# Patient Record
Sex: Male | Born: 1965
Health system: Southern US, Community
[De-identification: ages and names within clinical notes are randomized; demographics above are authoritative.]

## PROBLEM LIST (undated history)

## (undated) DIAGNOSIS — E785 Hyperlipidemia, unspecified: Secondary | ICD-10-CM

## (undated) HISTORY — DX: Hyperlipidemia, unspecified: E78.5

## (undated) HISTORY — PX: WISDOM TOOTH EXTRACTION: SHX21

## (undated) HISTORY — PX: JOINT REPLACEMENT: SHX530

## (undated) HISTORY — PX: HERNIA REPAIR: SHX51

---

## 1998-05-10 ENCOUNTER — Ambulatory Visit (HOSPITAL_BASED_OUTPATIENT_CLINIC_OR_DEPARTMENT_OTHER): Admission: RE | Admit: 1998-05-10 | Discharge: 1998-05-10 | Payer: Self-pay | Admitting: General Surgery

## 1999-01-31 ENCOUNTER — Ambulatory Visit (HOSPITAL_BASED_OUTPATIENT_CLINIC_OR_DEPARTMENT_OTHER): Admission: RE | Admit: 1999-01-31 | Discharge: 1999-01-31 | Payer: Self-pay | Admitting: General Surgery

## 2009-12-30 ENCOUNTER — Emergency Department (HOSPITAL_COMMUNITY): Admission: EM | Admit: 2009-12-30 | Discharge: 2009-12-30 | Payer: Self-pay | Admitting: Emergency Medicine

## 2010-10-06 LAB — CBC
MCHC: 34.5 g/dL (ref 30.0–36.0)
MCV: 88.8 fL (ref 78.0–100.0)
Platelets: 198 10*3/uL (ref 150–400)
RBC: 6.19 MIL/uL — ABNORMAL HIGH (ref 4.22–5.81)
WBC: 5.1 10*3/uL (ref 4.0–10.5)

## 2010-10-06 LAB — BASIC METABOLIC PANEL: Glucose, Bld: 122 mg/dL — ABNORMAL HIGH (ref 70–99)

## 2010-10-06 LAB — DIFFERENTIAL
Basophils Relative: 1 % (ref 0–1)
Eosinophils Relative: 1 % (ref 0–5)
Lymphs Abs: 1.6 10*3/uL (ref 0.7–4.0)
Monocytes Absolute: 0.4 10*3/uL (ref 0.1–1.0)
Neutrophils Relative %: 60 % (ref 43–77)

## 2013-03-22 ENCOUNTER — Other Ambulatory Visit: Payer: Self-pay | Admitting: Physician Assistant

## 2018-02-18 ENCOUNTER — Ambulatory Visit (INDEPENDENT_AMBULATORY_CARE_PROVIDER_SITE_OTHER): Payer: 59 | Admitting: Family Medicine

## 2018-02-18 ENCOUNTER — Encounter: Payer: Self-pay | Admitting: Family Medicine

## 2018-02-18 VITALS — BP 130/90 | HR 76 | Temp 98.1°F | Resp 14 | Ht 72.0 in | Wt 227.0 lb

## 2018-02-18 DIAGNOSIS — Z125 Encounter for screening for malignant neoplasm of prostate: Secondary | ICD-10-CM | POA: Diagnosis not present

## 2018-02-18 DIAGNOSIS — Z0000 Encounter for general adult medical examination without abnormal findings: Secondary | ICD-10-CM | POA: Diagnosis not present

## 2018-02-18 DIAGNOSIS — L290 Pruritus ani: Secondary | ICD-10-CM

## 2018-02-18 DIAGNOSIS — M25562 Pain in left knee: Secondary | ICD-10-CM | POA: Diagnosis not present

## 2018-02-18 DIAGNOSIS — Z13220 Encounter for screening for lipoid disorders: Secondary | ICD-10-CM

## 2018-02-18 NOTE — Progress Notes (Signed)
Subjective:    Patient ID: Johnny Clark, male    DOB: 26-Apr-1966, 52 y.o.   MRN: [id number]  HPI Patient is a very healthy 52 year old Caucasian male here today to establish care.  He denies any significant medical problems.  He denies any significant past medical history or surgical history.  2 major concerns.  #1 he has acute on chronic left knee pain.  He states that a horse fell on his knee approximately 7 years ago and injured it.  Initially the knee was extremely swollen however after time the swelling went down and the pain went away.  He sought no further treatment.  I would last 7 years however he complains of pain primarily over the medial and lateral joint line.  At times the knee will lock and catch on him.  He also does not feel that he can fully flex the knee.  On range of motion, the patient has crepitus in the knee joint as well as a palpable plica on either side of the patella that is causing him pain.  He has a negative Apley grind.  There is no laxity to varus or valgus stress.  He has a negative anterior and posterior drawer sign.  #2 he reports perirectal itching.  He states that as long as he has not gone to the bathroom he feels fine.  However after he defecates, he feels like he is unable to get all the stool off of him no matter how many times he wipes.  Frequently he will wipe so much that it becomes tender and sore.  However he continues to experience itching even after.  Quite often after he has a bowel movement, he will take a shower and this usually relieves the itching.  It occurs day and night.  No particular food makes it worse.  On rectal exam, there are no external hemorrhoids.  There are no visible fissures.  There is no abnormality seen around the rectum such as rashes or lesions. No past medical history on file.  Denies any surgical history No current outpatient medications on file prior to visit.   No current facility-administered medications on file prior to  visit.    No Known Allergies Social History   Socioeconomic History  . Marital status: Single    Spouse name: Not on file  . Number of children: Not on file  . Years of education: Not on file  . Highest education level: Not on file  Occupational History  . Not on file  Social Needs  . Financial resource strain: Not on file  . Food insecurity:    Worry: Not on file    Inability: Not on file  . Transportation needs:    Medical: Not on file    Non-medical: Not on file  Tobacco Use  . Smoking status: Never Smoker  . Smokeless tobacco: Never Used  Substance and Sexual Activity  . Alcohol use: Yes  . Drug use: Never  . Sexual activity: Yes  Lifestyle  . Physical activity:    Days per week: Not on file    Minutes per session: Not on file  . Stress: Not on file  Relationships  . Social connections:    Talks on phone: Not on file    Gets together: Not on file    Attends religious service: Not on file    Active member of club or organization: Not on file    Attends meetings of clubs or organizations: Not on  file    Relationship status: Not on file  . Intimate partner violence:    Fear of current or ex partner: Not on file    Emotionally abused: Not on file    Physically abused: Not on file    Forced sexual activity: Not on file  Other Topics Concern  . Not on file  Social History Narrative  . Not on file   Family History  Problem Relation Age of Onset  . Hypertension Mother   . Hypertension Father       Review of Systems  All other systems reviewed and are negative.      Objective:   Physical Exam  Constitutional: He is oriented to person, place, and time. He appears well-developed and well-nourished. No distress.  HENT:  Head: Normocephalic and atraumatic.  Right Ear: External ear normal.  Left Ear: External ear normal.  Nose: Nose normal.  Mouth/Throat: Oropharynx is clear and moist. No oropharyngeal exudate.  Eyes: Pupils are equal, round, and  reactive to light. Conjunctivae and EOM are normal. Right eye exhibits no discharge. Left eye exhibits no discharge. No scleral icterus.  Neck: Normal range of motion. Neck supple. No JVD present. No tracheal deviation present. No thyromegaly present.  Cardiovascular: Normal rate, regular rhythm, normal heart sounds and intact distal pulses. Exam reveals no gallop and no friction rub.  No murmur heard. Pulmonary/Chest: Effort normal and breath sounds normal. No stridor. No respiratory distress. He has no wheezes. He has no rales. He exhibits no tenderness.  Abdominal: Soft. Bowel sounds are normal. He exhibits no distension and no mass. There is no tenderness. There is no rebound and no guarding. No hernia.  Genitourinary: Rectum normal.  Musculoskeletal: He exhibits no edema or deformity.       Left knee: He exhibits decreased range of motion. He exhibits no swelling, no effusion, no deformity, no LCL laxity and normal meniscus. Tenderness found. Medial joint line and lateral joint line tenderness noted.  Lymphadenopathy:    He has no cervical adenopathy.  Neurological: He is alert and oriented to person, place, and time. He displays normal reflexes. No cranial nerve deficit or sensory deficit. He exhibits normal muscle tone. Coordination normal.  Skin: Skin is warm. No rash noted. He is not diaphoretic. No erythema. No pallor.  Psychiatric: He has a normal mood and affect. His behavior is normal. Judgment and thought content normal.  Vitals reviewed.         Assessment & Plan:  Acute pain of left knee - Plan: DG Knee Complete 4 Views Left  General medical exam - Plan: CBC with Differential/Platelet, COMPLETE METABOLIC PANEL WITH GFR, Lipid panel, PSA  Rectal itching  Prostate cancer screening - Plan: PSA  Screening cholesterol level - Plan: CBC with Differential/Platelet, COMPLETE METABOLIC PANEL WITH GFR, Lipid panel  I believe his left knee pain is most likely due to  osteoarthritis likely exacerbated by the injury he suffered after having a horse fall in his leg.  However there is also a palpable plica that is tender to palpation particular on the lateral aspect of his left patella.  Await the results of the knee x-ray for osteoarthritis.  If the knee x-ray is relatively normal, I would recommend an MRI to determine if the patient has underlying cartilage defect that may be amenable to arthroscopic surgery.  Regarding his perirectal itchy, there is no visible abnormality on rectal exam.  I recommended that he had a fiber supplement to allow more complete  evacuation so that the patient feels "clean" after defecating without having to wipe numerous times and still feels like he has left his rectal area soiled.  I believe that the residual fecal material may be likely irritating the skin causing the itching.  Regarding his physical exam, I recommended that he monitor his blood pressure as is borderline today.  He can do this at home twice a day and note the values and allow me to review them with him in a couple weeks.  I also asked him to return fasting for a CBC, CMP, fasting lipid panel, and a PSA.  I recommended scheduling the patient for a colonoscopy but he declined at the present time.

## 2018-02-25 ENCOUNTER — Ambulatory Visit
Admission: RE | Admit: 2018-02-25 | Discharge: 2018-02-25 | Disposition: A | Discharge status: Home / Self Care | Payer: Self-pay | Source: Ambulatory Visit | Attending: Family Medicine | Admitting: Family Medicine

## 2018-02-25 ENCOUNTER — Other Ambulatory Visit: Payer: 59

## 2018-02-25 DIAGNOSIS — M25562 Pain in left knee: Secondary | ICD-10-CM

## 2018-02-26 LAB — CBC WITH DIFFERENTIAL/PLATELET
Basophils Absolute: 32 cells/uL (ref 0–200)
Basophils Relative: 0.5 %
EOS PCT: 2.7 %
Eosinophils Absolute: 173 cells/uL (ref 15–500)
HCT: 49.1 % (ref 38.5–50.0)
HEMOGLOBIN: 16.4 g/dL (ref 13.2–17.1)
Lymphs Abs: 1734 cells/uL (ref 850–3900)
MCH: 30.3 pg (ref 27.0–33.0)
MCHC: 33.4 g/dL (ref 32.0–36.0)
MCV: 90.6 fL (ref 80.0–100.0)
MONOS PCT: 9.7 %
MPV: 10.9 fL (ref 7.5–12.5)
NEUTROS ABS: 3840 {cells}/uL (ref 1500–7800)
NEUTROS PCT: 60 %
PLATELETS: 238 10*3/uL (ref 140–400)
RBC: 5.42 10*6/uL (ref 4.20–5.80)
RDW: 13.3 % (ref 11.0–15.0)
TOTAL LYMPHOCYTE: 27.1 %
WBC mixed population: 621 cells/uL (ref 200–950)
WBC: 6.4 10*3/uL (ref 3.8–10.8)

## 2018-02-26 LAB — COMPLETE METABOLIC PANEL WITH GFR
AG Ratio: 1.6 (calc) (ref 1.0–2.5)
ALKALINE PHOSPHATASE (APISO): 65 U/L (ref 40–115)
ALT: 49 U/L — ABNORMAL HIGH (ref 9–46)
AST: 37 U/L — AB (ref 10–35)
Albumin: 4.4 g/dL (ref 3.6–5.1)
BILIRUBIN TOTAL: 0.7 mg/dL (ref 0.2–1.2)
BUN: 15 mg/dL (ref 7–25)
CHLORIDE: 104 mmol/L (ref 98–110)
CO2: 27 mmol/L (ref 20–32)
Calcium: 9.6 mg/dL (ref 8.6–10.3)
Creat: 0.9 mg/dL (ref 0.70–1.33)
GFR, Est African American: 115 mL/min/{1.73_m2} (ref >=60)
GFR, Est Non African American: 99 mL/min/{1.73_m2} (ref >=60)
GLUCOSE: 95 mg/dL (ref 65–99)
Globulin: 2.8 g/dL (calc) (ref 1.9–3.7)
Potassium: 4.3 mmol/L (ref 3.5–5.3)
Sodium: 139 mmol/L (ref 135–146)
Total Protein: 7.2 g/dL (ref 6.1–8.1)

## 2018-02-26 LAB — LIPID PANEL
CHOL/HDL RATIO: 4.5 (calc) (ref <5.0)
CHOLESTEROL: 189 mg/dL (ref <200)
HDL: 42 mg/dL (ref >40)
LDL Cholesterol (Calc): 124 mg/dL (calc) — ABNORMAL HIGH
Non-HDL Cholesterol (Calc): 147 mg/dL (calc) — ABNORMAL HIGH (ref <130)
Triglycerides: 120 mg/dL (ref <150)

## 2018-02-26 LAB — PSA: PSA: 0.6 ng/mL (ref <=4.0)

## 2018-03-04 ENCOUNTER — Other Ambulatory Visit: Payer: Self-pay | Admitting: Family Medicine

## 2018-03-04 DIAGNOSIS — R7989 Other specified abnormal findings of blood chemistry: Secondary | ICD-10-CM

## 2018-03-04 DIAGNOSIS — R945 Abnormal results of liver function studies: Principal | ICD-10-CM

## 2018-03-18 ENCOUNTER — Other Ambulatory Visit: Payer: 59

## 2018-03-18 DIAGNOSIS — R7989 Other specified abnormal findings of blood chemistry: Secondary | ICD-10-CM

## 2018-03-18 DIAGNOSIS — M25562 Pain in left knee: Secondary | ICD-10-CM | POA: Insufficient documentation

## 2018-03-18 DIAGNOSIS — R945 Abnormal results of liver function studies: Principal | ICD-10-CM

## 2018-03-18 DIAGNOSIS — M1990 Unspecified osteoarthritis, unspecified site: Secondary | ICD-10-CM | POA: Insufficient documentation

## 2018-03-19 LAB — COMPREHENSIVE METABOLIC PANEL
AG Ratio: 1.5 (calc) (ref 1.0–2.5)
ALBUMIN MSPROF: 4.3 g/dL (ref 3.6–5.1)
ALT: 42 U/L (ref 9–46)
AST: 34 U/L (ref 10–35)
Alkaline phosphatase (APISO): 68 U/L (ref 40–115)
BUN: 16 mg/dL (ref 7–25)
CHLORIDE: 107 mmol/L (ref 98–110)
CO2: 25 mmol/L (ref 20–32)
Calcium: 9.7 mg/dL (ref 8.6–10.3)
Creat: 1.06 mg/dL (ref 0.70–1.33)
GLOBULIN: 2.9 g/dL (ref 1.9–3.7)
GLUCOSE: 87 mg/dL (ref 65–99)
Potassium: 4.2 mmol/L (ref 3.5–5.3)
Sodium: 140 mmol/L (ref 135–146)
Total Bilirubin: 0.6 mg/dL (ref 0.2–1.2)
Total Protein: 7.2 g/dL (ref 6.1–8.1)

## 2018-04-07 ENCOUNTER — Encounter (INDEPENDENT_AMBULATORY_CARE_PROVIDER_SITE_OTHER): Payer: Self-pay | Admitting: Family Medicine

## 2018-04-07 ENCOUNTER — Encounter (INDEPENDENT_AMBULATORY_CARE_PROVIDER_SITE_OTHER): Payer: Self-pay

## 2018-04-07 ENCOUNTER — Ambulatory Visit (INDEPENDENT_AMBULATORY_CARE_PROVIDER_SITE_OTHER): Payer: 59 | Admitting: Family Medicine

## 2018-04-07 DIAGNOSIS — M25562 Pain in left knee: Secondary | ICD-10-CM

## 2018-04-07 DIAGNOSIS — G8929 Other chronic pain: Secondary | ICD-10-CM

## 2018-04-07 NOTE — Progress Notes (Signed)
   Office Visit Note   Patient: Johnny Clark           Date of Birth: Nov 18, 1965           MRN: [id number] Visit Date: 04/07/2018 Requested by: Donita BrooksPickard, Warren T, MD [address] [place], [place] [id number] PCP: Donita BrooksPickard, Warren T, MD  Subjective: Chief Complaint  Patient presents with  . Left Knee - Pain    Intermittent pain and swelling x 2 years, after a horse fell on that knee.  Pain has been more constant over the past 6 months.    HPI: He is here for another opinion regarding chronic left knee pain.  About 6 or 7 years ago a horse fell on his knee.  He had quite a bit of pain for several months, but then it became manageable.  He did well for several years after that but in the past 2 or 3 years he has had progressively worsening pain, now all day long.  It is primarily on the medial aspect but occasionally anterior.  He gets swelling and stiffness, no locking or giving way.  He had x-rays obtained showing end-stage medial compartment and moderate patellofemoral DJD.  We looked at those x-rays today.  He went to another orthopedist who discussed injections and total knee replacement.  Patient was not ready for surgery and he stated that cortisone shot was very painful and did not give him much relief in the past, so he chose a medial compartment unloading brace instead.  The brace made his pain worse.  He has since read about unicompartmental replacements and wonders whether he might be a candidate.  He continues to ride horses.  He is otherwise in very good health.              ROS: All other systems were negative  Objective: Vital Signs: There were no vitals taken for this visit.  Physical Exam:  Left knee: 1+ effusion, no warmth or erythema.  2+ patellofemoral crepitus but negative patella compression test.  Lachman's is solid, slight laxity with valgus stress but a solid endpoint.  Tender on the posterior medial joint line, moderate tenderness on the lateral joint line, no  pain or click with McMurray's test.  Imaging: No new x-rays today, we reviewed his previous x-rays from August.  Dr. Roda ShuttersXu looked at the x-rays as well but did not see the patient today.  Assessment & Plan: 1.  Chronic left knee pain mainly due to end-stage medial compartment DJD - Dr. Roda ShuttersXu felt that he might be a candidate for unicompartmental replacement but would like to see him first and discuss options in detail.  Patient will make an appointment at his earliest convenience.   Follow-Up Instructions: Return for Consult with Xu for unicompartmental replacement.       Procedures: None today.   PMFS History: There are no active problems to display for this patient.  History reviewed. No pertinent past medical history.  Family History  Problem Relation Age of Onset  . Hypertension Mother   . Hypertension Father     History reviewed. No pertinent surgical history. Social History   Occupational History  . Not on file  Tobacco Use  . Smoking status: Never Smoker  . Smokeless tobacco: Never Used  Substance and Sexual Activity  . Alcohol use: Yes  . Drug use: Never  . Sexual activity: Yes

## 2018-04-14 ENCOUNTER — Ambulatory Visit (INDEPENDENT_AMBULATORY_CARE_PROVIDER_SITE_OTHER): Payer: 59 | Admitting: Orthopaedic Surgery

## 2018-04-14 DIAGNOSIS — M1712 Unilateral primary osteoarthritis, left knee: Secondary | ICD-10-CM

## 2018-04-14 NOTE — Progress Notes (Signed)
   Office Visit Note   Patient: Johnny Clark           Date of Birth: 1966/02/05           MRN: [id number] Visit Date: 04/14/2018              Requested by: Donita Brooks, MD [address] [place], [place] [address] PCP: Donita Brooks, MD   Assessment & Plan: Visit Diagnoses:  1. Unilateral primary osteoarthritis, left knee     Plan: Impression is end-stage left knee tricompartment degenerative joint disease.  At this point patient has failed conservative treatment.  He seems to be struggling a lot in terms of quality life and activity.  Proceed with a total knee replacement after discussion of risks and benefits and rehab and recovery.  He denies history of DVT, nickel allergy, aspirin allergy. Total face to face encounter time was greater than 25 minutes and over half of this time was spent in counseling and/or coordination of care.  Follow-Up Instructions: Return for 2 week postop visit.   Orders:  No orders of the defined types were placed in this encounter.  No orders of the defined types were placed in this encounter.     Procedures: No procedures performed   Clinical Data: No additional findings.   Subjective: Chief Complaint  Patient presents with  . Left Knee - Pain    Johnny Clark is a 52 year old gentleman who comes in with chronic left knee pain.  He has had severe limitation of ADLs and quality of life.  He has had previous cortisone injections with minimal relief.  He has significant popping and catching that is worse with walking.  His knee wants to give out at random times.  He denies a history of surgery.  Denies any numbness and tingling or radiation of pain.   Review of Systems  Constitutional: Negative.   All other systems reviewed and are negative.    Objective: Vital Signs: There were no vitals taken for this visit.  Physical Exam  Constitutional: He is oriented to person, place, and time. He appears well-developed and  well-nourished.  HENT:  Head: Normocephalic and atraumatic.  Eyes: Pupils are equal, round, and reactive to light.  Neck: Neck supple.  Pulmonary/Chest: Effort normal.  Abdominal: Soft.  Musculoskeletal: Normal range of motion.  Neurological: He is alert and oriented to person, place, and time.  Skin: Skin is warm.  Psychiatric: He has a normal mood and affect. His behavior is normal. Judgment and thought content normal.  Nursing note and vitals reviewed.   Ortho Exam Left knee exam shows a small joint effusion.  He has a mild varus alignment.  Collaterals and cruciates are stable.  Medial joint line tenderness. Specialty Comments:  No specialty comments available.  Imaging: No results found.   PMFS History: Patient Active Problem List   Diagnosis Date Noted  . Pain in left knee 03/18/2018  . Osteoarthritis 03/18/2018   No past medical history on file.  Family History  Problem Relation Age of Onset  . Hypertension Mother   . Hypertension Father     No past surgical history on file. Social History   Occupational History  . Not on file  Tobacco Use  . Smoking status: Never Smoker  . Smokeless tobacco: Never Used  Substance and Sexual Activity  . Alcohol use: Yes  . Drug use: Never  . Sexual activity: Yes

## 2018-04-19 NOTE — Pre-Procedure Instructions (Signed)
Johnny Clark  04/19/2018      [id number] [place], [place] - [address] [place] [address] Phone: [phone] Fax: [phone]    Your procedure is scheduled on Monday October 14th.  Report to [facility] Admitting at 1300 A.M.  Call this number if you have problems the morning of surgery:  [phone]   Remember:  Do not eat or drink after midnight.      Take these medicines the morning of surgery with A SIP OF WATER   None  7 days prior to surgery STOP taking any Aspirin(unless otherwise instructed by your surgeon), Aleve, Naproxen, Ibuprofen, Motrin, Advil, Goody's, BC's, all herbal medications, fish oil, and all vitamins     Do not wear jewelry  Do not wear lotions, powders, or colognes, or deodorant.  Do not shave 48 hours prior to surgery.  Men may shave face and neck.  Do not bring valuables to the hospital.  [facility] is not responsible for any belongings or valuables.  Contacts, dentures or bridgework may not be worn into surgery.  Leave your suitcase in the car.  After surgery it may be brought to your room.  For patients admitted to the hospital, discharge time will be determined by your treatment team.  Patients discharged the day of surgery will not be allowed to drive home.    [facility]- Preparing For Surgery  Before surgery, you can play an important role. Because skin is not sterile, your skin needs to be as free of germs as possible. You can reduce the number of germs on your skin by washing with CHG (chlorahexidine gluconate) Soap before surgery.  CHG is an antiseptic cleaner which kills germs and bonds with the skin to continue killing germs even after washing.    Oral Hygiene is also important to reduce your risk of infection.  Remember - BRUSH YOUR TEETH THE MORNING OF SURGERY WITH YOUR REGULAR TOOTHPASTE  Please do not use if you have an allergy to CHG or  antibacterial soaps. If your skin becomes reddened/irritated stop using the CHG.  Do not shave (including legs and underarms) for at least 48 hours prior to first CHG shower. It is OK to shave your face.  Please follow these instructions carefully.   1. Shower the NIGHT BEFORE SURGERY and the MORNING OF SURGERY with CHG.   2. If you chose to wash your hair, wash your hair first as usual with your normal shampoo.  3. After you shampoo, rinse your hair and body thoroughly to remove the shampoo.  4. Use CHG as you would any other liquid soap. You can apply CHG directly to the skin and wash gently with a scrungie or a clean washcloth.   5. Apply the CHG Soap to your body ONLY FROM THE NECK DOWN.  Do not use on open wounds or open sores. Avoid contact with your eyes, ears, mouth and genitals (private parts). Wash Face and genitals (private parts)  with your normal soap.  6. Wash thoroughly, paying special attention to the area where your surgery will be performed.  7. Thoroughly rinse your body with warm water from the neck down.  8. DO NOT shower/wash with your normal soap after using and rinsing off the CHG Soap.  9. Pat yourself dry with a CLEAN TOWEL.  10. Wear CLEAN PAJAMAS to bed the night before surgery, wear comfortable clothes the morning  of surgery  11. Place CLEAN SHEETS on your bed the night of your first shower and DO NOT SLEEP WITH PETS.    Day of Surgery:  Do not apply any deodorants/lotions.  Please wear clean clothes to the hospital/surgery center.   Remember to brush your teeth WITH YOUR REGULAR TOOTHPASTE.    Please read over the following fact sheets that you were given.

## 2018-04-20 ENCOUNTER — Encounter (HOSPITAL_COMMUNITY)
Admission: RE | Admit: 2018-04-20 | Discharge: 2018-04-20 | Disposition: A | Discharge status: Home / Self Care | Payer: 59 | Source: Ambulatory Visit | Attending: Orthopaedic Surgery | Admitting: Orthopaedic Surgery

## 2018-04-20 ENCOUNTER — Other Ambulatory Visit: Payer: Self-pay

## 2018-04-20 ENCOUNTER — Encounter (HOSPITAL_COMMUNITY): Payer: Self-pay

## 2018-04-20 DIAGNOSIS — Z01818 Encounter for other preprocedural examination: Secondary | ICD-10-CM | POA: Diagnosis present

## 2018-04-20 DIAGNOSIS — R001 Bradycardia, unspecified: Secondary | ICD-10-CM | POA: Insufficient documentation

## 2018-04-20 DIAGNOSIS — M1712 Unilateral primary osteoarthritis, left knee: Secondary | ICD-10-CM | POA: Insufficient documentation

## 2018-04-20 LAB — CBC WITH DIFFERENTIAL/PLATELET
Abs Immature Granulocytes: 0 10*3/uL (ref 0.0–0.1)
BASOS ABS: 0 10*3/uL (ref 0.0–0.1)
Basophils Relative: 1 %
Eosinophils Absolute: 0.2 10*3/uL (ref 0.0–0.7)
Eosinophils Relative: 3 %
HCT: 46.2 % (ref 39.0–52.0)
HEMOGLOBIN: 15.6 g/dL (ref 13.0–17.0)
IMMATURE GRANULOCYTES: 0 %
LYMPHS PCT: 25 %
Lymphs Abs: 1.6 10*3/uL (ref 0.7–4.0)
MCH: 31.3 pg (ref 26.0–34.0)
MCHC: 33.8 g/dL (ref 30.0–36.0)
MCV: 92.6 fL (ref 78.0–100.0)
MONO ABS: 0.5 10*3/uL (ref 0.1–1.0)
MONOS PCT: 8 %
NEUTROS ABS: 4.2 10*3/uL (ref 1.7–7.7)
NEUTROS PCT: 63 %
Platelets: 217 10*3/uL (ref 150–400)
RBC: 4.99 MIL/uL (ref 4.22–5.81)
RDW: 13.1 % (ref 11.5–15.5)
WBC: 6.5 10*3/uL (ref 4.0–10.5)

## 2018-04-20 LAB — SURGICAL PCR SCREEN
MRSA, PCR: NEGATIVE
STAPHYLOCOCCUS AUREUS: NEGATIVE

## 2018-04-20 LAB — COMPREHENSIVE METABOLIC PANEL
ALK PHOS: 58 U/L (ref 38–126)
ALT: 45 U/L — AB (ref 0–44)
AST: 40 U/L (ref 15–41)
Albumin: 4.1 g/dL (ref 3.5–5.0)
Anion gap: 9 (ref 5–15)
BUN: 12 mg/dL (ref 6–20)
CALCIUM: 9.3 mg/dL (ref 8.9–10.3)
CHLORIDE: 106 mmol/L (ref 98–111)
CO2: 25 mmol/L (ref 22–32)
CREATININE: 0.94 mg/dL (ref 0.61–1.24)
GFR calc non Af Amer: >60 mL/min (ref >60)
GLUCOSE: 84 mg/dL (ref 70–99)
Potassium: 3.8 mmol/L (ref 3.5–5.1)
SODIUM: 140 mmol/L (ref 135–145)
Total Bilirubin: 0.8 mg/dL (ref 0.3–1.2)
Total Protein: 7 g/dL (ref 6.5–8.1)

## 2018-04-20 LAB — TYPE AND SCREEN
ABO/RH(D): A POS
ANTIBODY SCREEN: NEGATIVE

## 2018-04-20 LAB — ABO/RH: ABO/RH(D): A POS

## 2018-04-20 NOTE — Progress Notes (Signed)
PCP - Lynnea Ferrier MD  EKG - 04/20/18  Blood Thinner Instructions: N/A Aspirin Instructions: N/A  Anesthesia review:  none  Patient denies shortness of breath, fever, cough and chest pain at PAT appointment   Patient verbalized understanding of instructions that were given to them at the PAT appointment. Patient was also instructed that they will need to review over the PAT instructions again at home before surgery.

## 2018-04-29 MED ORDER — TRANEXAMIC ACID 1000 MG/10ML IV SOLN
2000.0000 mg | INTRAVENOUS | Status: AC
  Administered 2018-05-02: 2000 mg via TOPICAL
  Filled 2018-04-29: qty 20

## 2018-04-29 MED ORDER — TRANEXAMIC ACID-NACL 1000-0.7 MG/100ML-% IV SOLN
1000.0000 mg | INTRAVENOUS | Status: AC
  Administered 2018-05-02: 1000 mg via INTRAVENOUS
  Filled 2018-04-29: qty 100

## 2018-05-02 ENCOUNTER — Encounter (HOSPITAL_COMMUNITY): Payer: Self-pay | Admitting: Urology

## 2018-05-02 ENCOUNTER — Inpatient Hospital Stay (HOSPITAL_COMMUNITY): Payer: 59 | Admitting: Anesthesiology

## 2018-05-02 ENCOUNTER — Encounter (HOSPITAL_COMMUNITY)
Admission: RE | Disposition: A | Discharge status: Home / Self Care | Payer: Self-pay | Source: Home / Self Care | Attending: Orthopaedic Surgery

## 2018-05-02 ENCOUNTER — Inpatient Hospital Stay (HOSPITAL_COMMUNITY)
Admission: RE | Admit: 2018-05-02 | Discharge: 2018-05-03 | DRG: 470 | Disposition: A | Discharge status: Home / Self Care | Payer: 59 | Attending: Orthopaedic Surgery | Admitting: Orthopaedic Surgery

## 2018-05-02 ENCOUNTER — Inpatient Hospital Stay (HOSPITAL_COMMUNITY): Payer: 59

## 2018-05-02 DIAGNOSIS — Z96659 Presence of unspecified artificial knee joint: Secondary | ICD-10-CM

## 2018-05-02 DIAGNOSIS — Z8249 Family history of ischemic heart disease and other diseases of the circulatory system: Secondary | ICD-10-CM | POA: Diagnosis not present

## 2018-05-02 DIAGNOSIS — M1712 Unilateral primary osteoarthritis, left knee: Principal | ICD-10-CM

## 2018-05-02 DIAGNOSIS — Z96652 Presence of left artificial knee joint: Secondary | ICD-10-CM

## 2018-05-02 HISTORY — PX: TOTAL KNEE ARTHROPLASTY: SHX125

## 2018-05-02 SURGERY — ARTHROPLASTY, KNEE, TOTAL
Anesthesia: Spinal | Site: Knee | Laterality: Left

## 2018-05-02 MED ORDER — OXYCODONE HCL 5 MG PO TABS
5.0000 mg | ORAL_TABLET | ORAL | Status: DC | PRN
  Administered 2018-05-02 – 2018-05-03 (×3): 10 mg via ORAL
  Filled 2018-05-02 (×2): qty 2

## 2018-05-02 MED ORDER — GLYCOPYRROLATE 0.2 MG/ML IJ SOLN: INTRAMUSCULAR | Status: DC | PRN

## 2018-05-02 MED ORDER — MIDAZOLAM HCL 2 MG/2ML IJ SOLN
INTRAMUSCULAR | Status: AC
  Filled 2018-05-02: qty 2

## 2018-05-02 MED ORDER — GABAPENTIN 300 MG PO CAPS
ORAL_CAPSULE | ORAL | Status: AC
  Administered 2018-05-02: 300 mg via ORAL
  Filled 2018-05-02: qty 1

## 2018-05-02 MED ORDER — GABAPENTIN 300 MG PO CAPS
300.0000 mg | ORAL_CAPSULE | Freq: Once | ORAL | Status: AC
  Administered 2018-05-02: 300 mg via ORAL

## 2018-05-02 MED ORDER — BUPIVACAINE IN DEXTROSE 0.75-8.25 % IT SOLN
INTRATHECAL | Status: DC | PRN
  Administered 2018-05-02: 13.5 mg via INTRATHECAL

## 2018-05-02 MED ORDER — SODIUM CHLORIDE 0.9 % IR SOLN
Status: DC | PRN
  Administered 2018-05-02: 3000 mL

## 2018-05-02 MED ORDER — VANCOMYCIN HCL 1000 MG IV SOLR
INTRAVENOUS | Status: AC
  Filled 2018-05-02: qty 1000

## 2018-05-02 MED ORDER — KETOROLAC TROMETHAMINE 15 MG/ML IJ SOLN
30.0000 mg | Freq: Four times a day (QID) | INTRAMUSCULAR | Status: AC
  Administered 2018-05-02 – 2018-05-03 (×4): 30 mg via INTRAVENOUS
  Filled 2018-05-02 (×4): qty 2

## 2018-05-02 MED ORDER — TRANEXAMIC ACID-NACL 1000-0.7 MG/100ML-% IV SOLN
1000.0000 mg | Freq: Once | INTRAVENOUS | Status: AC
  Administered 2018-05-02: 1000 mg via INTRAVENOUS
  Filled 2018-05-02: qty 100

## 2018-05-02 MED ORDER — CHLORHEXIDINE GLUCONATE 4 % EX LIQD: 60.0000 mL | Freq: Once | CUTANEOUS | Status: DC

## 2018-05-02 MED ORDER — ACETAMINOPHEN 325 MG PO TABS: 325.0000 mg | ORAL_TABLET | Freq: Four times a day (QID) | ORAL | Status: DC | PRN

## 2018-05-02 MED ORDER — ASPIRIN 81 MG PO CHEW
81.0000 mg | CHEWABLE_TABLET | Freq: Two times a day (BID) | ORAL | Status: DC
  Administered 2018-05-02 – 2018-05-03 (×2): 81 mg via ORAL
  Filled 2018-05-02 (×2): qty 1

## 2018-05-02 MED ORDER — ACETAMINOPHEN 500 MG PO TABS
1000.0000 mg | ORAL_TABLET | Freq: Once | ORAL | Status: AC
  Administered 2018-05-02: 1000 mg via ORAL

## 2018-05-02 MED ORDER — PROPOFOL 500 MG/50ML IV EMUL
INTRAVENOUS | Status: DC | PRN
  Administered 2018-05-02: 75 ug/kg/min via INTRAVENOUS

## 2018-05-02 MED ORDER — PHENOL 1.4 % MT LIQD: 1.0000 | OROMUCOSAL | Status: DC | PRN

## 2018-05-02 MED ORDER — HYDROMORPHONE HCL 1 MG/ML IJ SOLN: 0.2500 mg | INTRAMUSCULAR | Status: DC | PRN

## 2018-05-02 MED ORDER — OXYCODONE HCL 5 MG PO TABS: 5.0000 mg | ORAL_TABLET | ORAL | 0 refills | Status: DC | PRN

## 2018-05-02 MED ORDER — DOCUSATE SODIUM 100 MG PO CAPS
100.0000 mg | ORAL_CAPSULE | Freq: Two times a day (BID) | ORAL | Status: DC
  Administered 2018-05-02 – 2018-05-03 (×2): 100 mg via ORAL
  Filled 2018-05-02 (×3): qty 1

## 2018-05-02 MED ORDER — SENNOSIDES-DOCUSATE SODIUM 8.6-50 MG PO TABS: 1.0000 | ORAL_TABLET | Freq: Every evening | ORAL | 1 refills | Status: DC | PRN

## 2018-05-02 MED ORDER — ACETAMINOPHEN 500 MG PO TABS
1000.0000 mg | ORAL_TABLET | Freq: Four times a day (QID) | ORAL | Status: AC
  Administered 2018-05-02 – 2018-05-03 (×4): 1000 mg via ORAL
  Filled 2018-05-02 (×4): qty 2

## 2018-05-02 MED ORDER — MIDAZOLAM HCL 2 MG/2ML IJ SOLN
INTRAMUSCULAR | Status: AC
  Administered 2018-05-02: 2 mg via INTRAVENOUS
  Filled 2018-05-02: qty 2

## 2018-05-02 MED ORDER — METHOCARBAMOL 500 MG PO TABS
ORAL_TABLET | ORAL | Status: AC
  Filled 2018-05-02: qty 1

## 2018-05-02 MED ORDER — ALUM & MAG HYDROXIDE-SIMETH 200-200-20 MG/5ML PO SUSP: 30.0000 mL | ORAL | Status: DC | PRN

## 2018-05-02 MED ORDER — ONDANSETRON HCL 4 MG/2ML IJ SOLN: 4.0000 mg | Freq: Four times a day (QID) | INTRAMUSCULAR | Status: DC | PRN

## 2018-05-02 MED ORDER — OXYCODONE HCL 5 MG PO TABS
10.0000 mg | ORAL_TABLET | ORAL | Status: DC | PRN
  Administered 2018-05-03: 15 mg via ORAL
  Filled 2018-05-02: qty 3

## 2018-05-02 MED ORDER — OXYCODONE HCL ER 10 MG PO T12A: 10.0000 mg | EXTENDED_RELEASE_TABLET | Freq: Two times a day (BID) | ORAL | 0 refills | Status: DC

## 2018-05-02 MED ORDER — ROPIVACAINE HCL 5 MG/ML IJ SOLN
INTRAMUSCULAR | Status: DC | PRN
  Administered 2018-05-02: 20 mL via PERINEURAL

## 2018-05-02 MED ORDER — FENTANYL CITRATE (PF) 100 MCG/2ML IJ SOLN
INTRAMUSCULAR | Status: AC
  Administered 2018-05-02: 100 ug via INTRAVENOUS
  Filled 2018-05-02: qty 2

## 2018-05-02 MED ORDER — ATROPINE SULFATE 0.4 MG/ML IJ SOLN: INTRAMUSCULAR | Status: DC | PRN

## 2018-05-02 MED ORDER — FENTANYL CITRATE (PF) 100 MCG/2ML IJ SOLN
INTRAMUSCULAR | Status: DC | PRN
  Administered 2018-05-02: 50 ug via INTRAVENOUS

## 2018-05-02 MED ORDER — HYDROMORPHONE HCL 1 MG/ML IJ SOLN: 0.5000 mg | INTRAMUSCULAR | Status: DC | PRN

## 2018-05-02 MED ORDER — DEXAMETHASONE SODIUM PHOSPHATE 10 MG/ML IJ SOLN
10.0000 mg | Freq: Once | INTRAMUSCULAR | Status: AC
  Administered 2018-05-03: 10 mg via INTRAVENOUS
  Filled 2018-05-02: qty 1

## 2018-05-02 MED ORDER — ASPIRIN EC 81 MG PO TBEC: 81.0000 mg | DELAYED_RELEASE_TABLET | Freq: Two times a day (BID) | ORAL | 0 refills | Status: DC

## 2018-05-02 MED ORDER — CEFAZOLIN SODIUM-DEXTROSE 2-4 GM/100ML-% IV SOLN
2.0000 g | Freq: Four times a day (QID) | INTRAVENOUS | Status: AC
  Administered 2018-05-02 – 2018-05-03 (×3): 2 g via INTRAVENOUS
  Filled 2018-05-02 (×3): qty 100

## 2018-05-02 MED ORDER — FENTANYL CITRATE (PF) 100 MCG/2ML IJ SOLN
100.0000 ug | Freq: Once | INTRAMUSCULAR | Status: AC
  Administered 2018-05-02: 100 ug via INTRAVENOUS

## 2018-05-02 MED ORDER — ONDANSETRON HCL 4 MG/2ML IJ SOLN
INTRAMUSCULAR | Status: DC | PRN
  Administered 2018-05-02: 4 mg via INTRAVENOUS

## 2018-05-02 MED ORDER — VANCOMYCIN HCL 1000 MG IV SOLR
INTRAVENOUS | Status: DC | PRN
  Administered 2018-05-02: 1000 mg via TOPICAL

## 2018-05-02 MED ORDER — SORBITOL 70 % SOLN: 30.0000 mL | Freq: Every day | Status: DC | PRN

## 2018-05-02 MED ORDER — BUPIVACAINE LIPOSOME 1.3 % IJ SUSP
20.0000 mL | INTRAMUSCULAR | Status: AC
  Administered 2018-05-02: 20 mL
  Filled 2018-05-02: qty 20

## 2018-05-02 MED ORDER — METHOCARBAMOL 750 MG PO TABS: 750.0000 mg | ORAL_TABLET | Freq: Two times a day (BID) | ORAL | 0 refills | Status: DC | PRN

## 2018-05-02 MED ORDER — OXYCODONE HCL 5 MG PO TABS
ORAL_TABLET | ORAL | Status: AC
  Filled 2018-05-02: qty 2

## 2018-05-02 MED ORDER — ONDANSETRON HCL 4 MG PO TABS: 4.0000 mg | ORAL_TABLET | Freq: Three times a day (TID) | ORAL | 0 refills | Status: DC | PRN

## 2018-05-02 MED ORDER — MIDAZOLAM HCL 2 MG/2ML IJ SOLN
2.0000 mg | Freq: Once | INTRAMUSCULAR | Status: AC
  Administered 2018-05-02: 2 mg via INTRAVENOUS

## 2018-05-02 MED ORDER — METHOCARBAMOL 1000 MG/10ML IJ SOLN
500.0000 mg | Freq: Four times a day (QID) | INTRAVENOUS | Status: DC | PRN
  Filled 2018-05-02: qty 5

## 2018-05-02 MED ORDER — ONDANSETRON HCL 4 MG PO TABS: 4.0000 mg | ORAL_TABLET | Freq: Four times a day (QID) | ORAL | Status: DC | PRN

## 2018-05-02 MED ORDER — DIPHENHYDRAMINE HCL 12.5 MG/5ML PO ELIX: 25.0000 mg | ORAL_SOLUTION | ORAL | Status: DC | PRN

## 2018-05-02 MED ORDER — ONDANSETRON HCL 4 MG/2ML IJ SOLN
INTRAMUSCULAR | Status: AC
  Filled 2018-05-02: qty 2

## 2018-05-02 MED ORDER — MIDAZOLAM HCL 5 MG/5ML IJ SOLN
INTRAMUSCULAR | Status: DC | PRN
  Administered 2018-05-02: 0.5 mg via INTRAVENOUS
  Administered 2018-05-02: 2 mg via INTRAVENOUS
  Administered 2018-05-02: 1 mg via INTRAVENOUS
  Administered 2018-05-02: 0.5 mg via INTRAVENOUS

## 2018-05-02 MED ORDER — SODIUM CHLORIDE 0.9% FLUSH
INTRAVENOUS | Status: DC | PRN
  Administered 2018-05-02 (×4): 10 mL

## 2018-05-02 MED ORDER — METOCLOPRAMIDE HCL 5 MG PO TABS: 5.0000 mg | ORAL_TABLET | Freq: Three times a day (TID) | ORAL | Status: DC | PRN

## 2018-05-02 MED ORDER — MENTHOL 3 MG MT LOZG: 1.0000 | LOZENGE | OROMUCOSAL | Status: DC | PRN

## 2018-05-02 MED ORDER — SODIUM CHLORIDE 0.9 % IV SOLN
INTRAVENOUS | Status: DC
  Administered 2018-05-02 – 2018-05-03 (×2): via INTRAVENOUS

## 2018-05-02 MED ORDER — MAGNESIUM CITRATE PO SOLN: 1.0000 | Freq: Once | ORAL | Status: DC | PRN

## 2018-05-02 MED ORDER — ACETAMINOPHEN 500 MG PO TABS
ORAL_TABLET | ORAL | Status: AC
  Administered 2018-05-02: 1000 mg via ORAL
  Filled 2018-05-02: qty 2

## 2018-05-02 MED ORDER — PROMETHAZINE HCL 25 MG PO TABS: 25.0000 mg | ORAL_TABLET | Freq: Four times a day (QID) | ORAL | 1 refills | Status: DC | PRN

## 2018-05-02 MED ORDER — 0.9 % SODIUM CHLORIDE (POUR BTL) OPTIME
TOPICAL | Status: DC | PRN
  Administered 2018-05-02: 1000 mL

## 2018-05-02 MED ORDER — FENTANYL CITRATE (PF) 250 MCG/5ML IJ SOLN
INTRAMUSCULAR | Status: AC
  Filled 2018-05-02: qty 5

## 2018-05-02 MED ORDER — LIDOCAINE HCL (CARDIAC) PF 100 MG/5ML IV SOSY
PREFILLED_SYRINGE | INTRAVENOUS | Status: DC | PRN
  Administered 2018-05-02: 30 mg via INTRAVENOUS

## 2018-05-02 MED ORDER — LACTATED RINGERS IV SOLN
INTRAVENOUS | Status: DC
  Administered 2018-05-02 (×2): via INTRAVENOUS

## 2018-05-02 MED ORDER — CELECOXIB 200 MG PO CAPS
200.0000 mg | ORAL_CAPSULE | Freq: Two times a day (BID) | ORAL | Status: DC
  Administered 2018-05-03: 200 mg via ORAL
  Filled 2018-05-02 (×2): qty 1

## 2018-05-02 MED ORDER — OXYCODONE HCL ER 10 MG PO T12A
10.0000 mg | EXTENDED_RELEASE_TABLET | Freq: Two times a day (BID) | ORAL | Status: DC
  Administered 2018-05-02 – 2018-05-03 (×2): 10 mg via ORAL
  Filled 2018-05-02 (×2): qty 1

## 2018-05-02 MED ORDER — CEFAZOLIN SODIUM-DEXTROSE 2-4 GM/100ML-% IV SOLN
2.0000 g | INTRAVENOUS | Status: AC
  Administered 2018-05-02: 2 g via INTRAVENOUS

## 2018-05-02 MED ORDER — DEXMEDETOMIDINE HCL IN NACL 200 MCG/50ML IV SOLN
INTRAVENOUS | Status: DC | PRN
  Administered 2018-05-02 (×3): 8 ug via INTRAVENOUS

## 2018-05-02 MED ORDER — METOCLOPRAMIDE HCL 5 MG/ML IJ SOLN: 5.0000 mg | Freq: Three times a day (TID) | INTRAMUSCULAR | Status: DC | PRN

## 2018-05-02 MED ORDER — CEFAZOLIN SODIUM-DEXTROSE 2-4 GM/100ML-% IV SOLN
INTRAVENOUS | Status: AC
  Filled 2018-05-02: qty 100

## 2018-05-02 MED ORDER — GABAPENTIN 300 MG PO CAPS
300.0000 mg | ORAL_CAPSULE | Freq: Three times a day (TID) | ORAL | Status: DC
  Administered 2018-05-02 – 2018-05-03 (×3): 300 mg via ORAL
  Filled 2018-05-02 (×3): qty 1

## 2018-05-02 MED ORDER — METHOCARBAMOL 500 MG PO TABS
500.0000 mg | ORAL_TABLET | Freq: Four times a day (QID) | ORAL | Status: DC | PRN
  Administered 2018-05-02 – 2018-05-03 (×2): 500 mg via ORAL
  Filled 2018-05-02: qty 1

## 2018-05-02 MED ORDER — POLYETHYLENE GLYCOL 3350 17 G PO PACK: 17.0000 g | PACK | Freq: Every day | ORAL | Status: DC | PRN

## 2018-05-02 SURGICAL SUPPLY — 82 items
ALCOHOL ISOPROPYL (RUBBING) (MISCELLANEOUS) ×3 IMPLANT
BAG DECANTER FOR FLEXI CONT (MISCELLANEOUS) ×3 IMPLANT
BANDAGE ACE 6X5 VEL STRL LF (GAUZE/BANDAGES/DRESSINGS) ×2 IMPLANT
BANDAGE ESMARK 6X9 LF (GAUZE/BANDAGES/DRESSINGS) ×1 IMPLANT
BEARING TIBIAL INSERT SZ7 (Orthopedic Implant) IMPLANT
BLADE SAW SGTL 13.0X1.19X90.0M (BLADE) ×3 IMPLANT
BNDG CMPR 9X6 STRL LF SNTH (GAUZE/BANDAGES/DRESSINGS) ×1
BNDG CMPR MED 10X6 ELC LF (GAUZE/BANDAGES/DRESSINGS) ×1
BNDG ELASTIC 6X10 VLCR STRL LF (GAUZE/BANDAGES/DRESSINGS) ×3 IMPLANT
BNDG ESMARK 6X9 LF (GAUZE/BANDAGES/DRESSINGS) ×3
BOWL SMART MIX CTS (DISPOSABLE) ×3 IMPLANT
BSPLAT TIB 7 KN TRITANIUM (Knees) ×1 IMPLANT
CLOSURE STERI-STRIP 1/2X4 (GAUZE/BANDAGES/DRESSINGS) ×1
CLSR STERI-STRIP ANTIMIC 1/2X4 (GAUZE/BANDAGES/DRESSINGS) ×3 IMPLANT
COVER SURGICAL LIGHT HANDLE (MISCELLANEOUS) ×3 IMPLANT
COVER WAND RF STERILE (DRAPES) ×3 IMPLANT
CUFF TOURNIQUET SINGLE 34IN LL (TOURNIQUET CUFF) ×3 IMPLANT
CUFF TOURNIQUET SINGLE 44IN (TOURNIQUET CUFF) IMPLANT
DRAPE EXTREMITY T 121X128X90 (DRAPE) ×3 IMPLANT
DRAPE HALF SHEET 40X57 (DRAPES) ×3 IMPLANT
DRAPE INCISE IOBAN 66X45 STRL (DRAPES) IMPLANT
DRAPE ORTHO SPLIT 77X108 STRL (DRAPES) ×6
DRAPE POUCH INSTRU U-SHP 10X18 (DRAPES) ×3 IMPLANT
DRAPE SURG ORHT 6 SPLT 77X108 (DRAPES) ×2 IMPLANT
DRAPE U-SHAPE 47X51 STRL (DRAPES) ×6 IMPLANT
DRESSING AQUACEL AG SP 3.5X10 (GAUZE/BANDAGES/DRESSINGS) IMPLANT
DRSG AQUACEL AG SP 3.5X10 (GAUZE/BANDAGES/DRESSINGS) ×3
DURAPREP 26ML APPLICATOR (WOUND CARE) ×6 IMPLANT
ELECT CAUTERY BLADE 6.4 (BLADE) ×3 IMPLANT
ELECT REM PT RETURN 9FT ADLT (ELECTROSURGICAL) ×3
ELECTRODE REM PT RTRN 9FT ADLT (ELECTROSURGICAL) ×1 IMPLANT
FEMORAL POSTERIOR SZ7 LFT (Femur) IMPLANT
GAUZE SPONGE 4X4 12PLY STRL (GAUZE/BANDAGES/DRESSINGS) ×2 IMPLANT
GAUZE SPONGE 4X4 12PLY STRL LF (GAUZE/BANDAGES/DRESSINGS) ×3 IMPLANT
GLOVE BIOGEL PI IND STRL 7.0 (GLOVE) ×1 IMPLANT
GLOVE BIOGEL PI INDICATOR 7.0 (GLOVE) ×2
GLOVE ECLIPSE 7.0 STRL STRAW (GLOVE) ×9 IMPLANT
GLOVE SKINSENSE NS SZ7.5 (GLOVE) ×2
GLOVE SKINSENSE STRL SZ7.5 (GLOVE) ×1 IMPLANT
GLOVE SURG SYN 7.5  E (GLOVE) ×8
GLOVE SURG SYN 7.5 E (GLOVE) ×4 IMPLANT
GLOVE SURG SYN 7.5 PF PI (GLOVE) ×4 IMPLANT
GOWN STRL REIN XL XLG (GOWN DISPOSABLE) ×3 IMPLANT
GOWN STRL REUS W/ TWL LRG LVL3 (GOWN DISPOSABLE) ×1 IMPLANT
GOWN STRL REUS W/TWL LRG LVL3 (GOWN DISPOSABLE) ×3
HANDPIECE INTERPULSE COAX TIP (DISPOSABLE) ×3
HOOD PEEL AWAY FLYTE STAYCOOL (MISCELLANEOUS) ×6 IMPLANT
KIT BASIN OR (CUSTOM PROCEDURE TRAY) ×3 IMPLANT
KIT TURNOVER KIT B (KITS) ×3 IMPLANT
KNEE TIBIAL COMPONENT SZ7 (Knees) ×2 IMPLANT
MANIFOLD NEPTUNE II (INSTRUMENTS) ×3 IMPLANT
MARKER SKIN DUAL TIP RULER LAB (MISCELLANEOUS) ×3 IMPLANT
NDL SPNL 18GX3.5 QUINCKE PK (NEEDLE) ×2 IMPLANT
NEEDLE SPNL 18GX3.5 QUINCKE PK (NEEDLE) ×6 IMPLANT
NS IRRIG 1000ML POUR BTL (IV SOLUTION) ×3 IMPLANT
PACK TOTAL JOINT (CUSTOM PROCEDURE TRAY) ×3 IMPLANT
PAD ABD 8X10 STRL (GAUZE/BANDAGES/DRESSINGS) ×4 IMPLANT
PAD ARMBOARD 7.5X6 YLW CONV (MISCELLANEOUS) ×6 IMPLANT
PAD CAST 4YDX4 CTTN HI CHSV (CAST SUPPLIES) ×2 IMPLANT
PADDING CAST COTTON 4X4 STRL (CAST SUPPLIES) ×6
PADDING CAST COTTON 6X4 STRL (CAST SUPPLIES) ×3 IMPLANT
PATELLA ASYMMETRIC 38X11 KNEE (Orthopedic Implant) ×2 IMPLANT
POSTERIOR FEMORAL SZ7 LFT (Femur) ×3 IMPLANT
SAW OSC TIP CART 19.5X105X1.3 (SAW) ×3 IMPLANT
SET HNDPC FAN SPRY TIP SCT (DISPOSABLE) ×1 IMPLANT
STAPLER VISISTAT 35W (STAPLE) IMPLANT
SUCTION FRAZIER HANDLE 10FR (MISCELLANEOUS) ×2
SUCTION TUBE FRAZIER 10FR DISP (MISCELLANEOUS) ×1 IMPLANT
SUT ETHILON 2 0 FS 18 (SUTURE) IMPLANT
SUT MNCRL AB 4-0 PS2 18 (SUTURE) IMPLANT
SUT VIC AB 0 CT1 27 (SUTURE) ×6
SUT VIC AB 0 CT1 27XBRD ANBCTR (SUTURE) ×2 IMPLANT
SUT VIC AB 1 CTX 27 (SUTURE) ×9 IMPLANT
SUT VIC AB 2-0 CT1 27 (SUTURE) ×9
SUT VIC AB 2-0 CT1 TAPERPNT 27 (SUTURE) ×3 IMPLANT
SYR 50ML LL SCALE MARK (SYRINGE) ×6 IMPLANT
TIBIAL BEARING INSERT SZ7 (Orthopedic Implant) ×3 IMPLANT
TOWEL OR 17X24 6PK STRL BLUE (TOWEL DISPOSABLE) ×3 IMPLANT
TOWEL OR 17X26 10 PK STRL BLUE (TOWEL DISPOSABLE) ×3 IMPLANT
TRAY CATH 16FR W/PLASTIC CATH (SET/KITS/TRAYS/PACK) IMPLANT
UNDERPAD 30X30 (UNDERPADS AND DIAPERS) ×3 IMPLANT
WRAP KNEE MAXI GEL POST OP (GAUZE/BANDAGES/DRESSINGS) ×3 IMPLANT

## 2018-05-02 NOTE — Anesthesia Preprocedure Evaluation (Addendum)
Anesthesia Evaluation  Patient identified by MRN, date of birth, ID band Patient awake    Reviewed: Allergy & Precautions, H&P , NPO status , Patient's Chart, lab work & pertinent test results  Airway Mallampati: II  TM Distance: >3 FB Neck ROM: Full    Dental no notable dental hx. (+) Teeth Intact, Dental Advisory Given   Pulmonary neg pulmonary ROS,    Pulmonary exam normal breath sounds clear to auscultation       Cardiovascular negative cardio ROS   Rhythm:Regular Rate:Normal     Neuro/Psych negative neurological ROS  negative psych ROS   GI/Hepatic negative GI ROS, Neg liver ROS,   Endo/Other  negative endocrine ROS  Renal/GU negative Renal ROS  negative genitourinary   Musculoskeletal  (+) Arthritis , Osteoarthritis,    Abdominal   Peds  Hematology negative hematology ROS (+)   Anesthesia Other Findings   Reproductive/Obstetrics negative OB ROS                            Anesthesia Physical Anesthesia Plan  ASA: I  Anesthesia Plan: Spinal   Post-op Pain Management:  Regional for Post-op pain   Induction: Intravenous  PONV Risk Score and Plan: 2 and Propofol infusion, Midazolam and Ondansetron  Airway Management Planned: Simple Face Mask  Additional Equipment:   Intra-op Plan:   Post-operative Plan:   Informed Consent: I have reviewed the patients History and Physical, chart, labs and discussed the procedure including the risks, benefits and alternatives for the proposed anesthesia with the patient or authorized representative who has indicated his/her understanding and acceptance.   Dental advisory given  Plan Discussed with: CRNA  Anesthesia Plan Comments:         Anesthesia Quick Evaluation

## 2018-05-02 NOTE — Transfer of Care (Signed)
Immediate Anesthesia Transfer of Care Note  Patient: Johnny Clark  Procedure(s) Performed: LEFT TOTAL KNEE ARTHROPLASTY (Left Knee)  Patient Location: PACU  Anesthesia Type:Spinal and MAC combined with regional for post-op pain  Level of Consciousness: awake, alert , oriented and patient cooperative  Airway & Oxygen Therapy: Patient Spontanous Breathing  Post-op Assessment: Report given to RN and Post -op Vital signs reviewed and stable  Post vital signs: Reviewed and stable  Last Vitals:  Vitals Value Taken Time  BP    Temp    Pulse    Resp    SpO2      Last Pain:  Vitals:   05/02/18 1059  TempSrc:   PainSc: 2       Patients Stated Pain Goal: 0 ([id number])  Complications: No apparent anesthesia complications

## 2018-05-02 NOTE — Discharge Instructions (Signed)

## 2018-05-02 NOTE — H&P (Signed)
PREOPERATIVE H&P  Chief Complaint: left knee degenerative joint disease  HPI: Johnny Clark is a 52 y.o. male who presents for surgical treatment of left knee degenerative joint disease.  He denies any changes in medical history.  No past medical history on file. Past Surgical History:  Procedure Laterality Date  . HERNIA REPAIR    . WISDOM TOOTH EXTRACTION     Social History   Socioeconomic History  . Marital status: Single    Spouse name: Not on file  . Number of children: Not on file  . Years of education: Not on file  . Highest education level: Not on file  Occupational History  . Not on file  Social Needs  . Financial resource strain: Not on file  . Food insecurity:    Worry: Not on file    Inability: Not on file  . Transportation needs:    Medical: Not on file    Non-medical: Not on file  Tobacco Use  . Smoking status: Never Smoker  . Smokeless tobacco: Never Used  Substance and Sexual Activity  . Alcohol use: Yes    Comment: occasional social  . Drug use: Never  . Sexual activity: Yes  Lifestyle  . Physical activity:    Days per week: Not on file    Minutes per session: Not on file  . Stress: Not on file  Relationships  . Social connections:    Talks on phone: Not on file    Gets together: Not on file    Attends religious service: Not on file    Active member of club or organization: Not on file    Attends meetings of clubs or organizations: Not on file    Relationship status: Not on file  Other Topics Concern  . Not on file  Social History Narrative  . Not on file   Family History  Problem Relation Age of Onset  . Hypertension Mother   . Hypertension Father    No Known Allergies Prior to Admission medications   Medication Sig Start Date End Date Taking? Authorizing Provider  Calcium Polycarbophil (FIBER-CAPS PO) Take 2 tablets by mouth daily.   Yes [provider]  Multiple Vitamin (MULTIVITAMIN) tablet Take 1 tablet by mouth  daily.   Yes [provider]     Positive ROS: All other systems have been reviewed and were otherwise negative with the exception of those mentioned in the HPI and as above.  Physical Exam: General: Alert, no acute distress Cardiovascular: No pedal edema Respiratory: No cyanosis, no use of accessory musculature GI: abdomen soft Skin: No lesions in the area of chief complaint Neurologic: Sensation intact distally Psychiatric: Patient is competent for consent with normal mood and affect Lymphatic: no lymphedema  MUSCULOSKELETAL: exam stable  Assessment: left knee degenerative joint disease  Plan: Plan for Procedure(s): LEFT TOTAL KNEE ARTHROPLASTY  The risks benefits and alternatives were discussed with the patient including but not limited to the risks of nonoperative treatment, versus surgical intervention including infection, bleeding, nerve injury,  blood clots, cardiopulmonary complications, morbidity, mortality, among others, and they were willing to proceed.   Preoperative templating of the joint replacement has been completed, documented, and submitted to the Operating Room personnel in order to optimize intra-operative equipment management.  Anticipated LOS equal to or greater than 2 midnights due to - Age 75 and older with one or more of the following:  - Obesity  - Expected need for hospital services (PT, OT,  Nursing) required for safe  discharge  - Anticipated need for postoperative skilled nursing care or inpatient rehab  - Active co-morbidities: None  Glee Arvin, MD   05/02/2018 7:23 AM

## 2018-05-02 NOTE — Anesthesia Procedure Notes (Signed)
Anesthesia Regional Block: Adductor canal block   Pre-Anesthetic Checklist: ,, timeout performed, Correct Patient, Correct Site, Correct Laterality, Correct Procedure, Correct Position, site marked, Risks and benefits discussed, pre-op evaluation,  At surgeon's request and post-op pain management  Laterality: Left  Prep: Maximum Sterile Barrier Precautions used, chloraprep       Needles:  Injection technique: Single-shot  Needle Type: Echogenic Stimulator Needle     Needle Length: 9cm  Needle Gauge: 21     Additional Needles:   Procedures:,,,, ultrasound used (permanent image in chart),,,,  Narrative:  Start time: 05/02/2018 11:16 AM End time: 05/02/2018 11:26 AM Injection made incrementally with aspirations every 5 mL.  Performed by: Personally  Anesthesiologist: Gaynelle Adu, MD  Additional Notes: 2% Lidocaine skin wheel.

## 2018-05-02 NOTE — Op Note (Signed)
Total Knee Arthroplasty Procedure Note  Preoperative diagnosis: Left knee osteoarthritis  Postoperative diagnosis:same  Operative procedure: Left total knee arthroplasty. CPT [phone]  Surgeon: N. Glee Arvin, MD  Assist: Oneal Grout, PA-C; necessary for the timely completion of procedure and due to complexity of procedure.  Anesthesia: Spinal, regional  Tourniquet time: spinal  Implants used: [employer] Triatholon Uncemented Femur: PS 7 Tibia: 7 Patella: 38 mm Polyethylene: 9 mm  Indication: Johnny Clark is a 52 y.o. year old male with a history of knee pain. Having failed conservative management, the patient elected to proceed with a total knee arthroplasty.  We have reviewed the risk and benefits of the surgery and they elected to proceed after voicing understanding.  Procedure:  After informed consent was obtained and understanding of the risk were voiced including but not limited to bleeding, infection, damage to surrounding structures including nerves and vessels, blood clots, leg length inequality and the failure to achieve desired results, the operative extremity was marked with verbal confirmation of the patient in the holding area.   The patient was then brought to the operating room and transported to the operating room table in the supine position.  A tourniquet was applied to the operative extremity around the upper thigh. The operative limb was then prepped and draped in the usual sterile fashion and preoperative antibiotics were administered.  A time out was performed prior to the start of surgery confirming the correct extremity, preoperative antibiotic administration, as well as team members, implants and instruments available for the case. Correct surgical site was also confirmed with preoperative radiographs. The limb was then elevated for exsanguination and the tourniquet was inflated. A midline incision was made and a standard medial parapatellar approach  was performed.  The patella was prepared and sized to a 38 mm.  A cover was placed on the patella for protection from retractors.  We then turned our attention to the femur. Posterior cruciate ligament was sacrificed. Start site was drilled in the femur and the intramedullary distal femoral cutting guide was placed, set at 5 degrees valgus, taking 9 mm of distal resection. The distal cut was made. Osteophytes were then removed. Next, the proximal tibial cutting guide was placed with appropriate slope, varus/valgus alignment and depth of resection. The proximal tibial cut was made. Gap blocks were then used to assess the extension gap and alignment, and appropriate soft tissue releases were performed. Attention was turned back to the femur, which was sized using the sizing guide to a size 7. Appropriate rotation of the femoral component was determined using epicondylar axis, Whiteside's line, and assessing the flexion gap under ligament tension. The appropriate size 4-in-1 cutting block was placed and cuts were made. Posterior femoral osteophytes and uncapped bone were then removed with the curved osteotome. The tibia was sized for a size 7 component. The femoral box-cutting guide was placed and prepared for a PS femoral component. Trial components were placed, and stability was checked in full extension, mid-flexion, and deep flexion. Proper tibial rotation was determined and marked.  The patella tracked well without a lateral release. Trial components were then removed and tibial preparation performed. A posterior capsular injection comprising of 20 cc of 1.3% exparel and 40 cc of normal saline was performed for postoperative pain control. The bony surfaces were irrigated with a pulse lavage and then dried. The final components sized above were malleted into place. The stability of the construct was re-evaluated throughout a range of motion and found  to be acceptable. The trial liner was removed, the knee was  copiously irrigated, and the knee was re-evaluated for any excess bone debris. The real polyethylene liner, 9 mm thick, was inserted and checked to ensure the locking mechanism had engaged appropriately. The tourniquet was deflated and hemostasis was achieved. The wound was irrigated with normal saline.  One gram of vancomycin powder was placed in the surgical bed.  A drain was not placed.  Capsular closure was performed with a #1 vicryl, subcutaneous fat closed with a 0 vicryl suture, then subcutaneous tissue closed with interrupted 2.0 vicryl suture. The skin was then closed with a 3.0 monocryl. A sterile dressing was applied.  The patient was awakened in the operating room and taken to recovery in stable condition. All sponge, needle, and instrument counts were correct at the end of the case.  Position: supine  Complications: none.  Time Out: performed   Drains/Packing: none  Estimated blood loss: minimal  Returned to Recovery Room: in good condition.   Antibiotics: yes   Mechanical VTE (DVT) Prophylaxis: sequential compression devices, TED thigh-high  Chemical VTE (DVT) Prophylaxis: aspirin  Fluid Replacement  Crystalloid: see anesthesia record Blood: none  FFP: none   Specimens Removed: 1 to pathology   Sponge and Instrument Count Correct? yes   PACU: portable radiograph - knee AP and Lateral   Admission: inpatient status  Plan/RTC: Return in 2 weeks for wound check.   Weight Bearing/Load Lower Extremity: full   N. Glee Arvin, MD [facility] [phone] 2:51 PM

## 2018-05-02 NOTE — Anesthesia Procedure Notes (Signed)
Spinal  Patient location during procedure: OR Start time: 05/02/2018 1:21 PM End time: 05/02/2018 1:26 PM Staffing Anesthesiologist: Gaynelle Adu, MD Performed: anesthesiologist  Preanesthetic Checklist Completed: patient identified, surgical consent, pre-op evaluation, timeout performed, IV checked, risks and benefits discussed and monitors and equipment checked Spinal Block Patient position: sitting Prep: DuraPrep Patient monitoring: cardiac monitor, continuous pulse ox and blood pressure Approach: midline Location: L3-4 Injection technique: single-shot Needle Needle type: Pencan  Needle gauge: 24 G Needle length: 9 cm Assessment Sensory level: T8 Additional Notes Functioning IV was confirmed and monitors were applied. Sterile prep and drape, including hand hygiene and sterile gloves were used. The patient was positioned and the spine was prepped. The skin was anesthetized with lidocaine.  Free flow of clear CSF was obtained prior to injecting local anesthetic into the CSF.  The spinal needle aspirated freely following injection.  The needle was carefully withdrawn.  The patient tolerated the procedure well.

## 2018-05-02 NOTE — Progress Notes (Signed)
Orthopedic Tech Progress Note Patient Details:  Johnny Clark Jan 23, 1966 [id number]  CPM Left Knee CPM Left Knee: On Left Knee Flexion (Degrees): 90 Left Knee Extension (Degrees): 0 Additional Comments: foot roll  Post Interventions Patient Tolerated: Well Instructions Provided: Care of device  Saul Fordyce 05/02/2018, 4:52 PM

## 2018-05-02 NOTE — Progress Notes (Signed)
Patient transferred from PACU to 5N18. He is awake oriented and vitals are stable. He arrived in the CPM and is comfortable. His wife is present, both oriented to room, unit, call bell, and nursing staff phone numbers. Left knee dressing clean, dry, intact. Will continue to monitor.

## 2018-05-03 ENCOUNTER — Encounter (HOSPITAL_COMMUNITY): Payer: Self-pay | Admitting: Orthopaedic Surgery

## 2018-05-03 ENCOUNTER — Other Ambulatory Visit (INDEPENDENT_AMBULATORY_CARE_PROVIDER_SITE_OTHER): Payer: Self-pay | Admitting: Physician Assistant

## 2018-05-03 ENCOUNTER — Other Ambulatory Visit: Payer: Self-pay

## 2018-05-03 LAB — CBC
HCT: 41.4 % (ref 39.0–52.0)
HEMOGLOBIN: 13.5 g/dL (ref 13.0–17.0)
MCH: 29.9 pg (ref 26.0–34.0)
MCHC: 32.6 g/dL (ref 30.0–36.0)
MCV: 91.8 fL (ref 80.0–100.0)
Platelets: 189 10*3/uL (ref 150–400)
RBC: 4.51 MIL/uL (ref 4.22–5.81)
RDW: 13.1 % (ref 11.5–15.5)
WBC: 7.4 10*3/uL (ref 4.0–10.5)
nRBC: 0 % (ref 0.0–0.2)

## 2018-05-03 LAB — BASIC METABOLIC PANEL
Anion gap: 7 (ref 5–15)
BUN: 9 mg/dL (ref 6–20)
CHLORIDE: 105 mmol/L (ref 98–111)
CO2: 27 mmol/L (ref 22–32)
Calcium: 8.9 mg/dL (ref 8.9–10.3)
Creatinine, Ser: 1.05 mg/dL (ref 0.61–1.24)
GFR calc non Af Amer: >60 mL/min (ref >60)
Glucose, Bld: 125 mg/dL — ABNORMAL HIGH (ref 70–99)
POTASSIUM: 4 mmol/L (ref 3.5–5.1)
SODIUM: 139 mmol/L (ref 135–145)

## 2018-05-03 MED ORDER — ONDANSETRON HCL 4 MG PO TABS: 4.0000 mg | ORAL_TABLET | Freq: Three times a day (TID) | ORAL | 0 refills | Status: DC | PRN

## 2018-05-03 MED ORDER — OXYCODONE HCL 5 MG PO TABS: 5.0000 mg | ORAL_TABLET | ORAL | 0 refills | Status: DC | PRN

## 2018-05-03 MED ORDER — ASPIRIN EC 81 MG PO TBEC: 81.0000 mg | DELAYED_RELEASE_TABLET | Freq: Two times a day (BID) | ORAL | 0 refills | Status: DC

## 2018-05-03 MED ORDER — SENNOSIDES-DOCUSATE SODIUM 8.6-50 MG PO TABS: 1.0000 | ORAL_TABLET | Freq: Every evening | ORAL | 1 refills | Status: DC | PRN

## 2018-05-03 MED ORDER — OXYCODONE HCL ER 10 MG PO T12A: 10.0000 mg | EXTENDED_RELEASE_TABLET | Freq: Two times a day (BID) | ORAL | 0 refills | Status: DC

## 2018-05-03 MED ORDER — PROMETHAZINE HCL 25 MG PO TABS: 25.0000 mg | ORAL_TABLET | Freq: Four times a day (QID) | ORAL | 1 refills | Status: DC | PRN

## 2018-05-03 MED ORDER — METHOCARBAMOL 750 MG PO TABS: 750.0000 mg | ORAL_TABLET | Freq: Two times a day (BID) | ORAL | 0 refills | Status: DC | PRN

## 2018-05-03 NOTE — Progress Notes (Signed)
Patient and wife given discharge instructions and paper prescriptions, some not signed PA made aware and patient instructed to bring them by the [facility] office to be signed for the pharmacy, he and his wife are agreeable to this. PIV x 1 removed, patient discharged home by NT via wheelchair.

## 2018-05-03 NOTE — Progress Notes (Signed)
Subjective: 1 Day Post-Op Procedure(s) (LRB): LEFT TOTAL KNEE ARTHROPLASTY (Left) Patient reports pain as mild.  Doing well this am.   Objective: Vital signs in last 24 hours: Temp:  [97.5 F (36.4 C)-99 F (37.2 C)] 99 F (37.2 C) (10/15 0301) Pulse Rate:  [56-77] 63 (10/15 0301) Resp:  [11-21] 19 (10/15 0301) BP: (95-142)/(59-99) 111/67 (10/15 0301) SpO2:  [96 %-100 %] 96 % (10/15 0301) Weight:  [99.8 kg] 99.8 kg (10/14 1700)  Intake/Output from previous day: 10/14 0701 - 10/15 0700 In: 2939.4 [P.O.:560; I.V.:2179.4; IV Piggyback:200] Out: 1950 [Urine:1900; Blood:50] Intake/Output this shift: No intake/output data recorded.  Recent Labs    05/03/18 0136  HGB 13.5   Recent Labs    05/03/18 0136  WBC 7.4  RBC 4.51  HCT 41.4  PLT 189   Recent Labs    05/03/18 0136  NA 139  K 4.0  CL 105  CO2 27  BUN 9  CREATININE 1.05  GLUCOSE 125*  CALCIUM 8.9   No results for input(s): LABPT, INR in the last 72 hours.  Neurologically intact Neurovascular intact Sensation intact distally Intact pulses distally Dorsiflexion/Plantar flexion intact Incision: dressing C/D/I No cellulitis present Compartment soft  Anticipated LOS equal to or greater than 2 midnights due to - Age 9 and older with one or more of the following:  - Obesity  - Expected need for hospital services (PT, OT, Nursing) required for safe  discharge  - Anticipated need for postoperative skilled nursing care or inpatient rehab  - Active co-morbidities: None OR   - Unanticipated findings during/Post Surgery: Slow post-op progression: GI, pain control, mobility  - Patient is a high risk of re-admission due to: None   Assessment/Plan: 1 Day Post-Op Procedure(s) (LRB): LEFT TOTAL KNEE ARTHROPLASTY (Left) Advance diet Up with therapy D/C IV fluids Discharge home with home health after second PT session  WBAT LLE Dressing changed by me today    Cristie Hem 05/03/2018, 7:27 AM

## 2018-05-03 NOTE — Plan of Care (Signed)
  Problem: Clinical Measurements: Goal: Will remain free from infection Outcome: Progressing Note:  Pt receiving IV antibiotics.    Problem: Nutrition: Goal: Adequate nutrition will be maintained Outcome: Progressing   Problem: Elimination: Goal: Will not experience complications related to urinary retention Outcome: Progressing Note:  Pt able to void adequately using the urinal.    Problem: Pain Managment: Goal: General experience of comfort will improve Outcome: Progressing   Problem: Safety: Goal: Ability to remain free from injury will improve Outcome: Progressing Note:  Pt remains free of falls. Bed locked and in lowest position. Call bell within reach. Will continue to monitor and intervene accordingly.    Problem: Education: Goal: Knowledge of the prescribed therapeutic regimen will improve Outcome: Progressing   Problem: Activity: Goal: Ability to avoid complications of mobility impairment will improve Outcome: Progressing

## 2018-05-03 NOTE — Discharge Summary (Signed)
Patient ID: Johnny Clark MRN: [id number] DOB/AGE: [birth year] 52 y.o.  Admit date: 05/02/2018 Discharge date: 05/03/2018  Admission Diagnoses:  Principal Problem:   Degenerative arthritis of left knee Active Problems:   Total knee replacement status   Discharge Diagnoses:  Same  History reviewed. No pertinent past medical history.  Surgeries: Procedure(s): LEFT TOTAL KNEE ARTHROPLASTY on 05/02/2018   Consultants:   Discharged Condition: Improved  Hospital Course: HAROUT SCHEURICH is an 52 y.o. male who was admitted 05/02/2018 for operative treatment ofDegenerative arthritis of left knee. Patient has severe unremitting pain that affects sleep, daily activities, and work/hobbies. After pre-op clearance the patient was taken to the operating room on 05/02/2018 and underwent  Procedure(s): LEFT TOTAL KNEE ARTHROPLASTY.    Patient was given perioperative antibiotics:  Anti-infectives (From admission, onward)   Start     Dose/Rate Route Frequency Ordered Stop   05/02/18 1930  ceFAZolin (ANCEF) IVPB 2g/100 mL premix     2 g 200 mL/hr over 30 Minutes Intravenous Every 6 hours 05/02/18 1703 05/03/18 1329   05/02/18 1406  vancomycin (VANCOCIN) powder  Status:  Discontinued       As needed 05/02/18 1406 05/02/18 1530   05/02/18 1200  ceFAZolin (ANCEF) IVPB 2g/100 mL premix     2 g 200 mL/hr over 30 Minutes Intravenous On call to O.R. 05/02/18 1046 05/02/18 1315   05/02/18 1048  ceFAZolin (ANCEF) 2-4 GM/100ML-% IVPB    Note to Pharmacy:  Lorette Ang, Meredit: cabinet override      05/02/18 1048 05/02/18 1315       Patient was given sequential compression devices, early ambulation, and chemoprophylaxis to prevent DVT.  Patient benefited maximally from hospital stay and there were no complications.    Recent vital signs:  Patient Vitals for the past 24 hrs:  BP Temp Temp src Pulse Resp SpO2 Height Weight  05/03/18 0301 111/67 99 F (37.2 C) Oral 63 19 96 % - -  05/02/18 2342  107/66 98.8 F (37.1 C) Oral 68 18 98 % - -  05/02/18 1951 (!) 95/59 97.8 F (36.6 C) Oral (!) 56 18 98 % - -  05/02/18 1700 99/78 98.8 F (37.1 C) Oral 65 18 100 % 6' (1.829 m) 99.8 kg  05/02/18 1633 121/76 97.7 F (36.5 C) - (!) 58 15 100 % - -  05/02/18 1618 112/67 - - (!) 59 14 98 % - -  05/02/18 1603 119/78 - - 63 15 100 % - -  05/02/18 1548 112/78 - - (!) 58 16 96 % - -  05/02/18 1544 111/73 (!) 97.5 F (36.4 C) - - - - - -  05/02/18 1205 130/78 - - 67 19 97 % - -  05/02/18 1200 134/90 - - 69 (!) 21 98 % - -  05/02/18 1155 135/76 - - 65 19 96 % - -  05/02/18 1150 128/86 - - 73 16 98 % - -  05/02/18 1145 (!) 141/90 - - 67 14 98 % - -  05/02/18 1140 (!) 142/97 - - 70 19 97 % - -  05/02/18 1135 140/82 - - 77 16 99 % - -  05/02/18 1125 (!) 125/99 - - 72 11 98 % - -  05/02/18 1053 133/89 97.9 F (36.6 C) Oral 69 20 97 % 6' (1.829 m) 99.8 kg     Recent laboratory studies:  Recent Labs    05/03/18 0136  WBC 7.4  HGB 13.5  HCT 41.4  PLT  189  NA 139  K 4.0  CL 105  CO2 27  BUN 9  CREATININE 1.05  GLUCOSE 125*  CALCIUM 8.9     Discharge Medications:   Allergies as of 05/03/2018   No Known Allergies     Medication List    TAKE these medications   aspirin EC 81 MG tablet Take 1 tablet (81 mg total) by mouth 2 (two) times daily.   FIBER-CAPS PO Take 2 tablets by mouth daily.   methocarbamol 750 MG tablet Commonly known as:  ROBAXIN Take 1 tablet (750 mg total) by mouth 2 (two) times daily as needed for muscle spasms.   multivitamin tablet Take 1 tablet by mouth daily.   ondansetron 4 MG tablet Commonly known as:  ZOFRAN Take 1-2 tablets (4-8 mg total) by mouth every 8 (eight) hours as needed for nausea or vomiting.   oxyCODONE 10 mg 12 hr tablet Commonly known as:  OXYCONTIN Take 1 tablet (10 mg total) by mouth every 12 (twelve) hours for 3 days.   oxyCODONE 5 MG immediate release tablet Commonly known as:  Oxy IR/ROXICODONE Take 1-3 tablets (5-15  mg total) by mouth every 4 (four) hours as needed.   promethazine 25 MG tablet Commonly known as:  PHENERGAN Take 1 tablet (25 mg total) by mouth every 6 (six) hours as needed for nausea.   senna-docusate 8.6-50 MG tablet Commonly known as:  Senokot-S Take 1 tablet by mouth at bedtime as needed.            Durable Medical Equipment  (From admission, onward)         Start     Ordered   05/02/18 1704  DME Walker rolling  Once    Question:  Patient needs a walker to treat with the following condition  Answer:  Total knee replacement status   05/02/18 1703   05/02/18 1704  DME 3 n 1  Once     05/02/18 1703   05/02/18 1704  DME Bedside commode  Once    Question:  Patient needs a bedside commode to treat with the following condition  Answer:  Total knee replacement status   05/02/18 1703          Diagnostic Studies: Dg Knee Left Port  Result Date: 05/02/2018 CLINICAL DATA:  Status post left knee arthroplasty. EXAM: PORTABLE LEFT KNEE - 1-2 VIEW COMPARISON:  Radiographs of February 25, 2018. FINDINGS: The femoral and tibial components appear to be well situated. No fracture or dislocation is noted. Expected postoperative changes are noted in the soft tissues anteriorly. IMPRESSION: Status post left total knee arthroplasty. Electronically Signed   By: Lupita Raider, M.D.   On: 05/02/2018 16:00    Disposition: Discharge disposition: 01-Home or Self Care         Follow-up Information    Tarry Kos, MD In 2 weeks.   Specialty:  Orthopedic Surgery Why:  For suture removal, For wound re-check Contact information: [address] [place] [address] [phone]            Signed: Cristie Hem 05/03/2018, 7:29 AM

## 2018-05-03 NOTE — Evaluation (Signed)
Physical Therapy Evaluation Patient Details Name: Johnny Clark MRN: [id number] DOB: April 13, 1966 Today's Date: 05/03/2018   History of Present Illness  Admitted for L TKA; WBAT  Clinical Impression  Patient is s/p above surgery resulting in functional limitations due to the deficits listed below (see PT Problem List). Overall managing quite well, and on track for dc home today;  Patient will benefit from skilled PT to increase their independence and safety with mobility to allow discharge to the venue listed below.       Follow Up Recommendations Follow surgeon's recommendation for DC plan and follow-up therapies    Equipment Recommendations  Rolling walker with 5" wheels    Recommendations for Other Services       Precautions / Restrictions Precautions Precautions: Knee Precaution Comments: Pt educated to not allow any pillow or bolster under knee for healing with optimal range of motion.  Restrictions Weight Bearing Restrictions: Yes LLE Weight Bearing: Weight bearing as tolerated      Mobility  Bed Mobility Overal bed mobility: Needs Assistance Bed Mobility: Supine to Sit     Supine to sit: Supervision     General bed mobility comments: Cues to breathe  Transfers Overall transfer level: Needs assistance Equipment used: Rolling walker (2 wheeled) Transfers: Sit to/from Stand Sit to Stand: Supervision         General transfer comment: Cues for hand placement and safety  Ambulation/Gait Ambulation/Gait assistance: Min guard Gait Distance (Feet): 150 Feet Assistive device: Rolling walker (2 wheeled) Gait Pattern/deviations: Step-through pattern     General Gait Details: Cues to activate quad for stance stability and to stand tall on LLE  Stairs Stairs: Yes Stairs assistance: Min guard Stair Management: One rail Left;Step to pattern;Sideways Number of Stairs: 4 General stair comments: Cues for sequence; overall managed stairs well  Wheelchair  Mobility    Modified Rankin (Stroke Patients Only)       Balance                                             Pertinent Vitals/Pain Pain Assessment: 0-10 Pain Score: 2  Pain Location: L knee Pain Descriptors / Indicators: Tightness Pain Intervention(s): Monitored during session    Home Living Family/patient expects to be discharged to:: Private residence Living Arrangements: Spouse/significant other Available Help at Discharge: Family;Available 24 hours/day Type of Home: House Home Access: Stairs to enter Entrance Stairs-Rails: Doctor, general practice of Steps: 3 Home Layout: One level Home Equipment: Emergency planning/management officer - 2 wheels      Prior Function Level of Independence: Independent               Hand Dominance        Extremity/Trunk Assessment   Upper Extremity Assessment Upper Extremity Assessment: Overall WFL for tasks assessed    Lower Extremity Assessment Lower Extremity Assessment: LLE deficits/detail LLE Deficits / Details: Grossly decr AROM and strength, limted by pain postop       Communication   Communication: No difficulties  Cognition Arousal/Alertness: Awake/alert Behavior During Therapy: WFL for tasks assessed/performed Overall Cognitive Status: Within Functional Limits for tasks assessed                                        General Comments  Exercises Total Joint Exercises Quad Sets: AROM;Left;20 reps Heel Slides: AROM;AAROM;Left;15 reps;Supine;Seated Goniometric ROM: approx 2-90 deg   Assessment/Plan    PT Assessment Patient needs continued PT services  PT Problem List Decreased strength;Decreased range of motion;Decreased balance;Decreased knowledge of use of DME       PT Treatment Interventions DME instruction;Gait training;Stair training;Functional mobility training;Therapeutic activities;Therapeutic exercise;Patient/family education    PT Goals (Current goals can be  found in the Care Plan section)  Acute Rehab PT Goals Patient Stated Goal: get back to horseback riding PT Goal Formulation: With patient Time For Goal Achievement: 05/10/18 Potential to Achieve Goals: Good    Frequency 7X/week   Barriers to discharge        Co-evaluation               AM-PAC PT "6 Clicks" Daily Activity  Outcome Measure Difficulty turning over in bed (including adjusting bedclothes, sheets and blankets)?: A Little Difficulty moving from lying on back to sitting on the side of the bed? : A Little Difficulty sitting down on and standing up from a chair with arms (e.g., wheelchair, bedside commode, etc,.)?: A Little Help needed moving to and from a bed to chair (including a wheelchair)?: A Little Help needed walking in hospital room?: A Little Help needed climbing 3-5 steps with a railing? : A Little 6 Click Score: 18    End of Session Equipment Utilized During Treatment: Gait belt Activity Tolerance: Patient tolerated treatment well Patient left: in chair;with call bell/phone within reach Nurse Communication: Mobility status PT Visit Diagnosis: Other abnormalities of gait and mobility (R26.89)    Time: 1610-9604 PT Time Calculation (min) (ACUTE ONLY): 32 min   Charges:   PT Evaluation $PT Eval Low Complexity: 1 Low PT Treatments $Gait Training: 8-22 mins        Van Clines, PT  Acute Rehabilitation Services Pager [phone] Office [phone]   Levi Aland 05/03/2018, 11:48 AM

## 2018-05-03 NOTE — Anesthesia Postprocedure Evaluation (Signed)
Anesthesia Post Note  Patient: Johnny Clark  Procedure(s) Performed: LEFT TOTAL KNEE ARTHROPLASTY (Left Knee)     Patient location during evaluation: PACU Anesthesia Type: Spinal and Regional Level of consciousness: oriented and awake and alert Pain management: pain level controlled Vital Signs Assessment: post-procedure vital signs reviewed and stable Respiratory status: spontaneous breathing and respiratory function stable Cardiovascular status: blood pressure returned to baseline and stable Postop Assessment: no headache, no backache and no apparent nausea or vomiting Anesthetic complications: no    Last Vitals:  Vitals:   05/02/18 2342 05/03/18 0301  BP: 107/66 111/67  Pulse: 68 63  Resp: 18 19  Temp: 37.1 C 37.2 C  SpO2: 98% 96%    Last Pain:  Vitals:   05/03/18 0500  TempSrc:   PainSc: 5                  Thorn Demas,W. EDMOND

## 2018-05-03 NOTE — Progress Notes (Signed)
Physical Therapy Treatment Patient Details Name: Johnny Clark MRN: [id number] DOB: January 18, 1966 Today's Date: 05/03/2018    History of Present Illness Admitted for L TKA; WBAT    PT Comments    Continuing work on functional mobility and activity tolerance;  Noting EXCELLENT progress, Questions solicited and answered; OK for dc home from PT standpoint    Follow Up Recommendations  Follow surgeon's recommendation for DC plan and follow-up therapies     Equipment Recommendations  None recommended by PT    Recommendations for Other Services       Precautions / Restrictions Precautions Precautions: Knee Precaution Booklet Issued: Yes (comment) Precaution Comments: Pt educated to not allow any pillow or bolster under knee for healing with optimal range of motion.  Restrictions LLE Weight Bearing: Weight bearing as tolerated    Mobility  Bed Mobility Overal bed mobility: Needs Assistance Bed Mobility: Supine to Sit     Supine to sit: Supervision     General bed mobility comments: Cues to breathe  Transfers Overall transfer level: Needs assistance Equipment used: Rolling walker (2 wheeled) Transfers: Sit to/from Stand Sit to Stand: Supervision         General transfer comment: Cues for hand placement and safety  Ambulation/Gait Ambulation/Gait assistance: Supervision Gait Distance (Feet): 180 Feet Assistive device: Rolling walker (2 wheeled) Gait Pattern/deviations: Step-through pattern     General Gait Details: Cues to activate quad for stance stability and to stand tall on LLE   Stairs Stairs: Yes Stairs assistance: Min guard Stair Management: One rail Left;Step to pattern;Sideways Number of Stairs: 4 General stair comments: Cues for sequence; overall managed stairs well   Wheelchair Mobility    Modified [place] (Stroke Patients Only)       Balance                                            Cognition Arousal/Alertness:  Awake/alert Behavior During Therapy: WFL for tasks assessed/performed Overall Cognitive Status: Within Functional Limits for tasks assessed                                        Exercises Total Joint Exercises Ankle Circles/Pumps: AROM Quad Sets: AROM;Left;20 reps Heel Slides: AROM;AAROM;Left;15 reps;Supine;Seated Long Arc Quad: AROM;Left;10 reps Knee Flexion: AROM;Left;10 reps;Seated Goniometric ROM: approx 2-90 deg    General Comments        Pertinent Vitals/Pain Pain Assessment: Faces Pain Score: 2  Faces Pain Scale: Hurts a little bit Pain Location: L knee Pain Descriptors / Indicators: Tightness Pain Intervention(s): Monitored during session    Home Living Family/patient expects to be discharged to:: Private residence Living Arrangements: Spouse/significant other Available Help at Discharge: Family;Available 24 hours/day Type of Home: House Home Access: Stairs to enter Entrance Stairs-Rails: Right;Left Home Layout: One level Home Equipment: Emergency planning/management officer - 2 wheels      Prior Function Level of Independence: Independent          PT Goals (current goals can now be found in the care plan section) Acute Rehab PT Goals Patient Stated Goal: get back to horseback riding PT Goal Formulation: With patient Time For Goal Achievement: 05/10/18 Potential to Achieve Goals: Good Progress towards PT goals: Progressing toward goals    Frequency    7X/week  PT Plan Current plan remains appropriate    Co-evaluation              AM-PAC PT "6 Clicks" Daily Activity  Outcome Measure  Difficulty turning over in bed (including adjusting bedclothes, sheets and blankets)?: None Difficulty moving from lying on back to sitting on the side of the bed? : None Difficulty sitting down on and standing up from a chair with arms (e.g., wheelchair, bedside commode, etc,.)?: A Little Help needed moving to and from a bed to chair (including a  wheelchair)?: None Help needed walking in hospital room?: None Help needed climbing 3-5 steps with a railing? : A Little 6 Click Score: 22    End of Session Equipment Utilized During Treatment: Gait belt Activity Tolerance: Patient tolerated treatment well Patient left: in bed;with call bell/phone within reach Nurse Communication: Mobility status PT Visit Diagnosis: Other abnormalities of gait and mobility (R26.89)     Time: 5409-8119 PT Time Calculation (min) (ACUTE ONLY): 26 min  Charges:  $Gait Training: 8-22 mins $Therapeutic Exercise: 8-22 mins                     Van Clines, PT  Acute Rehabilitation Services Pager [phone] Office [phone]    Levi Aland 05/03/2018, 1:22 PM

## 2018-05-05 ENCOUNTER — Telehealth (INDEPENDENT_AMBULATORY_CARE_PROVIDER_SITE_OTHER): Payer: Self-pay | Admitting: Orthopaedic Surgery

## 2018-05-05 ENCOUNTER — Other Ambulatory Visit (INDEPENDENT_AMBULATORY_CARE_PROVIDER_SITE_OTHER): Payer: Self-pay

## 2018-05-05 MED ORDER — OXYCODONE HCL ER 10 MG PO T12A: EXTENDED_RELEASE_TABLET | ORAL | 0 refills | Status: DC

## 2018-05-05 NOTE — Telephone Encounter (Signed)
Refill 4 more tabs.  10 mg bid

## 2018-05-05 NOTE — Telephone Encounter (Signed)
Patient's wife called requesting an RX refill on his OxyContin.  CB#[phone].  Thank you.

## 2018-05-05 NOTE — Telephone Encounter (Signed)
Debbie from [facility] called requesting VO for continuation of PT for the following:  2x a week for 1 week and 3x a week for 1 week  CB#[phone]

## 2018-05-05 NOTE — Telephone Encounter (Signed)
Okay on orders. 

## 2018-05-05 NOTE — Telephone Encounter (Signed)
See message below,

## 2018-05-05 NOTE — Telephone Encounter (Signed)
PENDING SIGNATURE WILL BE READY FOR PICK UP TOMORROW AM.

## 2018-05-05 NOTE — Telephone Encounter (Signed)
ytes

## 2018-05-05 NOTE — Telephone Encounter (Signed)
Johnny Clark will send Authorization over due to patient having to pay out of pocket please specify urgent   Oxycodone(Oxycontin)

## 2018-05-06 NOTE — Telephone Encounter (Signed)
Number below is incorrect. Called Dorene Sorrow from [facility] to approve on orders.

## 2018-05-06 NOTE — Telephone Encounter (Signed)
Called patient he is aware.

## 2018-05-13 ENCOUNTER — Ambulatory Visit (INDEPENDENT_AMBULATORY_CARE_PROVIDER_SITE_OTHER): Payer: 59 | Admitting: Orthopaedic Surgery

## 2018-05-13 DIAGNOSIS — M1712 Unilateral primary osteoarthritis, left knee: Secondary | ICD-10-CM

## 2018-05-13 MED ORDER — MUPIROCIN 2 % EX OINT: 1.0000 "application " | TOPICAL_OINTMENT | Freq: Two times a day (BID) | CUTANEOUS | 0 refills | Status: DC

## 2018-05-13 MED ORDER — OXYCODONE HCL 5 MG PO TABS: 5.0000 mg | ORAL_TABLET | Freq: Two times a day (BID) | ORAL | 0 refills | Status: DC | PRN

## 2018-05-13 NOTE — Progress Notes (Signed)
   Post-Op Visit Note   Patient: Johnny Clark           Date of Birth: 07-27-1965           MRN: [id number] Visit Date: 05/13/2018 PCP: Donita Brooks, MD   Assessment & Plan:  Chief Complaint:  Chief Complaint  Patient presents with  . Left Knee - Routine Post Op   Visit Diagnoses:  1. Unilateral primary osteoarthritis, left knee     Plan: Leander is 11 days status post left total knee replacement.  He is doing well.  Reports no calf pain.  Incision is clean dry and intact.  He has 1 more session of physical therapy.  He is doing well overall today.  I refilled his oxycodone.  Mupirocin ointment to the incision.  Steri-Strips were applied today.  Follow-up in 4 weeks with three-view x-rays of the left knee.  Continue aspirin for DVT prophylaxis.  Follow-Up Instructions: Return in about 4 weeks (around 06/10/2018).   Orders:  Orders Placed This Encounter  Procedures  . Ambulatory referral to Physical Therapy   Meds ordered this encounter  Medications  . oxyCODONE (OXY IR/ROXICODONE) 5 MG immediate release tablet    Sig: Take 1-3 tablets (5-15 mg total) by mouth 2 (two) times daily as needed.    Dispense:  30 tablet    Refill:  0  . mupirocin ointment (BACTROBAN) 2 %    Sig: Apply 1 application topically 2 (two) times daily.    Dispense:  22 g    Refill:  0    Imaging: No results found.  PMFS History: Patient Active Problem List   Diagnosis Date Noted  . Degenerative arthritis of left knee 05/02/2018  . Total knee replacement status 05/02/2018  . Pain in left knee 03/18/2018  . Osteoarthritis 03/18/2018   No past medical history on file.  Family History  Problem Relation Age of Onset  . Hypertension Mother   . Hypertension Father     Past Surgical History:  Procedure Laterality Date  . HERNIA REPAIR    . TOTAL KNEE ARTHROPLASTY Left 05/02/2018   Procedure: LEFT TOTAL KNEE ARTHROPLASTY;  Surgeon: Tarry Kos, MD;  Location: [facility] OR;  Service:  Orthopedics;  Laterality: Left;  . WISDOM TOOTH EXTRACTION     Social History   Occupational History  . Not on file  Tobacco Use  . Smoking status: Never Smoker  . Smokeless tobacco: Never Used  Substance and Sexual Activity  . Alcohol use: Yes    Comment: occasional social  . Drug use: Never  . Sexual activity: Yes

## 2018-06-10 ENCOUNTER — Ambulatory Visit (INDEPENDENT_AMBULATORY_CARE_PROVIDER_SITE_OTHER): Payer: 59

## 2018-06-10 ENCOUNTER — Encounter (INDEPENDENT_AMBULATORY_CARE_PROVIDER_SITE_OTHER): Payer: Self-pay | Admitting: Orthopaedic Surgery

## 2018-06-10 ENCOUNTER — Ambulatory Visit (INDEPENDENT_AMBULATORY_CARE_PROVIDER_SITE_OTHER): Payer: 59 | Admitting: Orthopaedic Surgery

## 2018-06-10 VITALS — Ht 72.0 in | Wt 220.0 lb

## 2018-06-10 DIAGNOSIS — M1712 Unilateral primary osteoarthritis, left knee: Secondary | ICD-10-CM

## 2018-06-10 DIAGNOSIS — Z96652 Presence of left artificial knee joint: Secondary | ICD-10-CM

## 2018-06-10 NOTE — Progress Notes (Signed)
   Post-Op Visit Note   Patient: Johnny Clark           Date of Birth: [birth year]           MRN: [id number] Visit Date: 06/10/2018 PCP: Donita BrooksPickard, Warren T, MD   Assessment & Plan:  Chief Complaint:  Chief Complaint  Patient presents with  . Left Knee - Routine Post Op   Visit Diagnoses:  1. Unilateral primary osteoarthritis, left knee   2. Status post total left knee replacement     Plan: Johnny Clark is nearly 6 weeks status post left total knee replacement.  He is overall doing well.  He is progressing with physical therapy.  He is able to achieve 111 degrees with physical therapy.  From ambulation standpoint he is still walking with an antalgic gait.  He denies any pain.  He does endorse moderate difficulty with using stairs.  He is very motivated to improve and get better with physical therapy.  He is not really taking any pain medicines.  He does have some persistent swelling.  His surgical scar is fully healed.  I feel that he is not yet ready to return back to work after listening to him describe his job functions.  I think that he needs to continue to work more on strengthening and range of motion before he can be safely released back to work.  We will extend his out of work for another 6 weeks.  He will follow-up at that time for reevaluation.  He is to continue with physical therapy in the meantime.  Follow-Up Instructions: Return in about 6 weeks (around 07/22/2018).   Orders:  Orders Placed This Encounter  Procedures  . XR KNEE 3 VIEW LEFT   No orders of the defined types were placed in this encounter.   Imaging: Xr Knee 3 View Left  Result Date: 06/10/2018 Stable left total knee replacement in good alignment.   PMFS History: Patient Active Problem List   Diagnosis Date Noted  . Degenerative arthritis of left knee 05/02/2018  . Total knee replacement status 05/02/2018  . Pain in left knee 03/18/2018  . Osteoarthritis 03/18/2018   No past medical history on file.    Family History  Problem Relation Age of Onset  . Hypertension Mother   . Hypertension Father     Past Surgical History:  Procedure Laterality Date  . HERNIA REPAIR    . TOTAL KNEE ARTHROPLASTY Left 05/02/2018   Procedure: LEFT TOTAL KNEE ARTHROPLASTY;  Surgeon: Tarry KosXu,  M, MD;  Location: [facility] OR;  Service: Orthopedics;  Laterality: Left;  . WISDOM TOOTH EXTRACTION     Social History   Occupational History  . Not on file  Tobacco Use  . Smoking status: Never Smoker  . Smokeless tobacco: Never Used  Substance and Sexual Activity  . Alcohol use: Yes    Comment: occasional social  . Drug use: Never  . Sexual activity: Yes

## 2018-07-19 ENCOUNTER — Encounter (INDEPENDENT_AMBULATORY_CARE_PROVIDER_SITE_OTHER): Payer: Self-pay | Admitting: Orthopaedic Surgery

## 2018-07-19 ENCOUNTER — Ambulatory Visit (INDEPENDENT_AMBULATORY_CARE_PROVIDER_SITE_OTHER): Payer: 59 | Admitting: Physician Assistant

## 2018-07-19 DIAGNOSIS — Z96652 Presence of left artificial knee joint: Secondary | ICD-10-CM

## 2018-07-19 MED ORDER — TRAMADOL HCL 50 MG PO TABS: 50.0000 mg | ORAL_TABLET | Freq: Four times a day (QID) | ORAL | 0 refills | Status: DC | PRN

## 2018-07-19 NOTE — Progress Notes (Signed)
   Post-Op Visit Note   Patient: Johnny Clark           Date of Birth: Jan 07, 1966           MRN: [id number] Visit Date: 07/19/2018 PCP: Donita BrooksPickard, Warren T, MD   Assessment & Plan:  Chief Complaint:  Chief Complaint  Patient presents with  . Left Knee - Follow-up   Visit Diagnoses:  1. S/P TKR (total knee replacement), left     Plan: Patient is a pleasant 52 year old gentleman who presents to our clinic today 78 days status post left total knee replacement, date of surgery 05/02/2018.  He has been doing fairly well.  He has been taking ibuprofen for pain.  He has continued to attend formal physical therapy as he still has some stiffness to the left knee.  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  Examination of the left knee reveals a well-healed surgical incision without evidence of infection.  Trace of effusion.  Range of motion 0 to 110 degrees.  Stable valgus varus stress.  He is neurovascularly intact distally.  At this point, would like for the patient to continue with physical therapy and a home exercise program to really work on flexion.  A new prescription was provided to the patient today for this.  He will follow-up with us in 8 weeks time for recheck.  Follow-Up Instructions: Return in about 8 weeks (around 09/13/2018).   Orders:  No orders of the defined types were placed in this encounter.  Meds ordered this encounter  Medications  . traMADol (ULTRAM) 50 MG tablet    Sig: Take 1 tablet (50 mg total) by mouth every 6 (six) hours as needed.    Dispense:  30 tablet    Refill:  0    Imaging: No results found.  PMFS History: Patient Active Problem List   Diagnosis Date Noted  . Degenerative arthritis of left knee 05/02/2018  . S/P TKR (total knee replacement), left 05/02/2018  . Pain in left knee 03/18/2018  . Osteoarthritis 03/18/2018   History reviewed. No pertinent past medical history.  Family History  Problem Relation Age of Onset   . Hypertension Mother   . Hypertension Father     Past Surgical History:  Procedure Laterality Date  . HERNIA REPAIR    . TOTAL KNEE ARTHROPLASTY Left 05/02/2018   Procedure: LEFT TOTAL KNEE ARTHROPLASTY;  Surgeon: Tarry KosXu, Naiping M, MD;  Location: [facility] OR;  Service: Orthopedics;  Laterality: Left;  . WISDOM TOOTH EXTRACTION     Social History   Occupational History  . Not on file  Tobacco Use  . Smoking status: Never Smoker  . Smokeless tobacco: Never Used  Substance and Sexual Activity  . Alcohol use: Yes    Comment: occasional social  . Drug use: Never  . Sexual activity: Yes

## 2018-08-31 ENCOUNTER — Telehealth (INDEPENDENT_AMBULATORY_CARE_PROVIDER_SITE_OTHER): Payer: Self-pay | Admitting: Orthopaedic Surgery

## 2018-08-31 NOTE — Telephone Encounter (Signed)
Patient's wife Kennyth Arnold called advised patient is going to get his teeth cleaned and will need an antibiotic prior to the appointment. Patient uses the [facility] [address] [place]. The number to contact Kennyth Arnold is [phone]

## 2018-09-01 ENCOUNTER — Other Ambulatory Visit (INDEPENDENT_AMBULATORY_CARE_PROVIDER_SITE_OTHER): Payer: Self-pay

## 2018-09-01 MED ORDER — AMOXICILLIN 500 MG PO TABS: ORAL_TABLET | ORAL | 0 refills | Status: DC

## 2018-09-01 NOTE — Telephone Encounter (Signed)
Called into pharm  

## 2018-09-01 NOTE — Telephone Encounter (Signed)
See message.

## 2018-09-01 NOTE — Telephone Encounter (Signed)
2 g amoxicillin

## 2018-09-13 ENCOUNTER — Encounter (INDEPENDENT_AMBULATORY_CARE_PROVIDER_SITE_OTHER): Payer: Self-pay | Admitting: Orthopaedic Surgery

## 2018-09-13 ENCOUNTER — Ambulatory Visit (INDEPENDENT_AMBULATORY_CARE_PROVIDER_SITE_OTHER): Payer: 59 | Admitting: Orthopaedic Surgery

## 2018-09-13 DIAGNOSIS — Z96652 Presence of left artificial knee joint: Secondary | ICD-10-CM

## 2018-09-13 NOTE — Progress Notes (Signed)
   Office Visit Note   Patient: Johnny Clark           Date of Birth: [birth year]           MRN: [id number] Visit Date: 09/13/2018              Requested by: Donita Brooks, MD [address] [place], [place] [address] PCP: Donita Brooks, MD   Assessment & Plan: Visit Diagnoses:  1. Status post total left knee replacement     Plan: Royale is 4 months status post left total knee replacement.  Overall he is happy with his outcome.  He does express frustration with inability to put on his socks due to lack of range of motion.  At this point I think it is too late to manipulate the knee due to risk of fracture.  It is unfortunate that his range of motion has actually gotten worse since we last saw him.  It sounds like with warming up plan with exercises the flexion does actually improved.  I have urged him to continue with aggressive range of motion home exercises.  I think swelling also has contributed to this.  Overall he is in agreement with the plan.  I like to recheck him in 2 months with 2 view x-rays of the left knee.  Follow-Up Instructions: Return in about 2 months (around 11/12/2018).   Orders:  No orders of the defined types were placed in this encounter.  No orders of the defined types were placed in this encounter.     Procedures: No procedures performed   Clinical Data: No additional findings.   Subjective: Chief Complaint  Patient presents with  . Left Knee - Pain, Routine Post Op    [name] is 4 months status post left total knee replacement.  He reports no pain.  He is having difficulty with flexion.  He was able to achieve 110 degrees of flexion at the last visit but this has gotten slightly worse.  He notices more discomfort with colder temperatures and with increased activity.   Review of Systems  Constitutional: Negative.   All other systems reviewed and are negative.    Objective: Vital Signs: There were no vitals taken for this  visit.  Physical Exam  Ortho Exam Left knee exam shows a fully healed surgical scar.  Range of motion is 0 to 105 degrees.  Stable to varus valgus. Specialty Comments:  No specialty comments available.  Imaging: No results found.   PMFS History: Patient Active Problem List   Diagnosis Date Noted  . Status post total left knee replacement 05/02/2018  . Osteoarthritis 03/18/2018   History reviewed. No pertinent past medical history.  Family History  Problem Relation Age of Onset  . Hypertension Mother   . Hypertension Father     Past Surgical History:  Procedure Laterality Date  . HERNIA REPAIR    . TOTAL KNEE ARTHROPLASTY Left 05/02/2018   Procedure: LEFT TOTAL KNEE ARTHROPLASTY;  Surgeon: Tarry Kos, MD;  Location: [facility] OR;  Service: Orthopedics;  Laterality: Left;  . WISDOM TOOTH EXTRACTION     Social History   Occupational History  . Not on file  Tobacco Use  . Smoking status: Never Smoker  . Smokeless tobacco: Never Used  Substance and Sexual Activity  . Alcohol use: Yes    Comment: occasional social  . Drug use: Never  . Sexual activity: Yes

## 2018-11-15 ENCOUNTER — Ambulatory Visit (INDEPENDENT_AMBULATORY_CARE_PROVIDER_SITE_OTHER): Payer: Self-pay | Admitting: Orthopaedic Surgery

## 2019-03-20 ENCOUNTER — Encounter (HOSPITAL_COMMUNITY): Payer: Self-pay | Admitting: Emergency Medicine

## 2019-03-20 ENCOUNTER — Emergency Department (HOSPITAL_COMMUNITY)
Admission: EM | Admit: 2019-03-20 | Discharge: 2019-03-20 | Disposition: A | Discharge status: Home / Self Care | Payer: 59 | Attending: Emergency Medicine | Admitting: Emergency Medicine

## 2019-03-20 ENCOUNTER — Other Ambulatory Visit: Payer: Self-pay

## 2019-03-20 ENCOUNTER — Other Ambulatory Visit: Payer: Self-pay | Admitting: Cardiology

## 2019-03-20 ENCOUNTER — Emergency Department (HOSPITAL_COMMUNITY): Payer: 59

## 2019-03-20 DIAGNOSIS — R002 Palpitations: Secondary | ICD-10-CM | POA: Diagnosis not present

## 2019-03-20 DIAGNOSIS — R079 Chest pain, unspecified: Secondary | ICD-10-CM | POA: Diagnosis not present

## 2019-03-20 LAB — MAGNESIUM: Magnesium: 1.8 mg/dL (ref 1.7–2.4)

## 2019-03-20 LAB — BASIC METABOLIC PANEL
Anion gap: 14 (ref 5–15)
BUN: 11 mg/dL (ref 6–20)
CO2: 20 mmol/L — ABNORMAL LOW (ref 22–32)
Calcium: 9.2 mg/dL (ref 8.9–10.3)
Chloride: 105 mmol/L (ref 98–111)
Creatinine, Ser: 0.9 mg/dL (ref 0.61–1.24)
GFR calc Af Amer: >60 mL/min (ref >60)
GFR calc non Af Amer: >60 mL/min (ref >60)
Glucose, Bld: 154 mg/dL — ABNORMAL HIGH (ref 70–99)
Potassium: 3.5 mmol/L (ref 3.5–5.1)
Sodium: 139 mmol/L (ref 135–145)

## 2019-03-20 LAB — CBC
HCT: 47.3 % (ref 39.0–52.0)
Hemoglobin: 15.7 g/dL (ref 13.0–17.0)
MCH: 30.5 pg (ref 26.0–34.0)
MCHC: 33.2 g/dL (ref 30.0–36.0)
MCV: 92 fL (ref 80.0–100.0)
Platelets: 235 10*3/uL (ref 150–400)
RBC: 5.14 MIL/uL (ref 4.22–5.81)
RDW: 13.1 % (ref 11.5–15.5)
WBC: 5.7 10*3/uL (ref 4.0–10.5)
nRBC: 0 % (ref 0.0–0.2)

## 2019-03-20 LAB — TROPONIN I (HIGH SENSITIVITY)
Troponin I (High Sensitivity): 5 ng/L (ref <18)
Troponin I (High Sensitivity): 6 ng/L (ref <18)

## 2019-03-20 MED ORDER — SODIUM CHLORIDE 0.9% FLUSH: 3.0000 mL | Freq: Once | INTRAVENOUS | Status: DC

## 2019-03-20 NOTE — ED Triage Notes (Signed)
Pt reports he woke up with a fast heart beat, sweats, arm numbness, and chest pressure. Pt denies any nausea or vomiting.

## 2019-03-20 NOTE — ED Notes (Signed)
Pt given dc instructions pt verbalizes understanding.  

## 2019-03-20 NOTE — ED Provider Notes (Signed)
[facility] EMERGENCY DEPARTMENT Provider Note   CSN: [id number] Arrival date & time: 03/20/19  0309     History   Chief Complaint Chief Complaint  Patient presents with  . Chest Pain  . Tachycardia    HPI Johnny Clark is a 53 y.o. male.     The history is provided by the patient and medical records. No language interpreter was used.  Palpitations Palpitations quality:  Regular Onset quality:  Sudden Duration:  10 minutes Timing:  Sporadic Progression:  Resolved Chronicity:  New Relieved by:  Nothing Worsened by:  Nothing Ineffective treatments:  None tried Associated symptoms: chest pain   Associated symptoms: no back pain, no chest pressure, no cough, no diaphoresis, no dizziness, no leg pain, no lower extremity edema, no malaise/fatigue, no nausea, no near-syncope, no numbness, no shortness of breath, no syncope, no vomiting and no weakness   Risk factors: no hx of atrial fibrillation, no hx of PE and no hx of thyroid disease     History reviewed. No pertinent past medical history.  Patient Active Problem List   Diagnosis Date Noted  . Status post total left knee replacement 05/02/2018  . Osteoarthritis 03/18/2018    Past Surgical History:  Procedure Laterality Date  . HERNIA REPAIR    . TOTAL KNEE ARTHROPLASTY Left 05/02/2018   Procedure: LEFT TOTAL KNEE ARTHROPLASTY;  Surgeon: Leandrew Koyanagi, MD;  Location: [facility];  Service: Orthopedics;  Laterality: Left;  . WISDOM TOOTH EXTRACTION          Home Medications    Prior to Admission medications   Medication Sig Start Date End Date Taking? Authorizing Provider  amoxicillin (AMOXIL) 500 MG tablet TAKE 4 TABS PO 30 MINS BEFORE PROCEDURE 09/01/18   Leandrew Koyanagi, MD  aspirin EC 81 MG tablet Take 1 tablet (81 mg total) by mouth 2 (two) times daily. 05/03/18   Aundra Dubin, PA-C  Calcium Polycarbophil (FIBER-CAPS PO) Take 2 tablets by mouth daily.    [provider]   methocarbamol (ROBAXIN) 750 MG tablet Take 1 tablet (750 mg total) by mouth 2 (two) times daily as needed for muscle spasms. 05/03/18   Aundra Dubin, PA-C  Multiple Vitamin (MULTIVITAMIN) tablet Take 1 tablet by mouth daily.    [provider]  mupirocin ointment (BACTROBAN) 2 % Apply 1 application topically 2 (two) times daily. 05/13/18   Leandrew Koyanagi, MD  ondansetron (ZOFRAN) 4 MG tablet Take 1-2 tablets (4-8 mg total) by mouth every 8 (eight) hours as needed for nausea or vomiting. 05/03/18   Aundra Dubin, PA-C  oxyCODONE (OXY IR/ROXICODONE) 5 MG immediate release tablet Take 1-3 tablets (5-15 mg total) by mouth every 4 (four) hours as needed. 05/03/18   Aundra Dubin, PA-C  oxyCODONE (OXY IR/ROXICODONE) 5 MG immediate release tablet Take 1-3 tablets (5-15 mg total) by mouth 2 (two) times daily as needed. 05/13/18   Leandrew Koyanagi, MD  oxyCODONE (OXYCONTIN) 10 mg 12 hr tablet TAKE 1 TAB (10 mg) bid 05/05/18   Leandrew Koyanagi, MD  promethazine (PHENERGAN) 25 MG tablet Take 1 tablet (25 mg total) by mouth every 6 (six) hours as needed for nausea. 05/03/18   Aundra Dubin, PA-C  senna-docusate (SENOKOT S) 8.6-50 MG tablet Take 1 tablet by mouth at bedtime as needed. 05/03/18   Aundra Dubin, PA-C  traMADol (ULTRAM) 50 MG tablet Take 1 tablet (50 mg total) by mouth every 6 (six)  hours as needed. 07/19/18   Cristie HemStanbery, Mary L, PA-C    Family History Family History  Problem Relation Age of Onset  . Hypertension Mother   . Hypertension Father     Social History Social History   Tobacco Use  . Smoking status: Never Smoker  . Smokeless tobacco: Never Used  Substance Use Topics  . Alcohol use: Yes    Comment: occasional social  . Drug use: Never     Allergies   Patient has no known allergies.   Review of Systems Review of Systems  Constitutional: Negative for chills, diaphoresis, fatigue, fever and malaise/fatigue.  HENT: Negative for congestion.   Eyes:  Negative for visual disturbance.  Respiratory: Negative for cough, choking, chest tightness, shortness of breath, wheezing and stridor.   Cardiovascular: Positive for chest pain and palpitations. Negative for leg swelling, syncope and near-syncope.  Gastrointestinal: Negative for abdominal pain, constipation, diarrhea, nausea and vomiting.  Genitourinary: Negative for dysuria, flank pain and frequency.  Musculoskeletal: Negative for back pain, neck pain and neck stiffness.  Skin: Negative for rash and wound.  Neurological: Negative for dizziness, weakness, light-headedness, numbness and headaches.  Psychiatric/Behavioral: Negative for agitation.  All other systems reviewed and are negative.    Physical Exam Updated Vital Signs BP 134/81 (BP Location: Right Arm)   Pulse 80   Resp 16   SpO2 100%   Physical Exam Vitals signs and nursing note reviewed.  Constitutional:      General: He is not in acute distress.    Appearance: He is well-developed. He is not ill-appearing, toxic-appearing or diaphoretic.  HENT:     Head: Normocephalic and atraumatic.  Eyes:     Conjunctiva/sclera: Conjunctivae normal.     Pupils: Pupils are equal, round, and reactive to light.  Neck:     Musculoskeletal: Normal range of motion and neck supple.  Cardiovascular:     Rate and Rhythm: Normal rate and regular rhythm.     Heart sounds: No murmur.  Pulmonary:     Effort: Pulmonary effort is normal. No respiratory distress.     Breath sounds: Normal breath sounds. No decreased breath sounds, wheezing, rhonchi or rales.  Chest:     Chest wall: No tenderness.  Abdominal:     Palpations: Abdomen is soft.     Tenderness: There is no abdominal tenderness.  Musculoskeletal:     Right lower leg: He exhibits no tenderness. No edema.     Left lower leg: He exhibits no tenderness. No edema.  Skin:    General: Skin is warm and dry.     Capillary Refill: Capillary refill takes less than 2 seconds.   Neurological:     General: No focal deficit present.     Mental Status: He is alert.      ED Treatments / Results  Labs (all labs ordered are listed, but only abnormal results are displayed) Labs Reviewed  BASIC METABOLIC PANEL - Abnormal; Notable for the following components:      Result Value   CO2 20 (*)    Glucose, Bld 154 (*)    All other components within normal limits  CBC  MAGNESIUM  TROPONIN I (HIGH SENSITIVITY)  TROPONIN I (HIGH SENSITIVITY)    EKG EKG Interpretation  Date/Time:  Monday March 20 2019 03:24:50 EDT Ventricular Rate:  95 PR Interval:  154 QRS Duration: 96 QT Interval:  368 QTC Calculation: 462 R Axis:   81 Text Interpretation:  Normal sinus rhythm Nonspecific ST abnormality  Abnormal ECG When compared to prior, no significant cahgnes seen.  No STEMI Confirmed by Theda Belfast ([id number]) on 03/20/2019 7:09:25 AM   Radiology Dg Chest 2 View  Result Date: 03/20/2019 CLINICAL DATA:  Chest pressure, tachycardia, and diaphoresis beginning this morning. EXAM: CHEST - 2 VIEW COMPARISON:  12/30/2009 FINDINGS: The heart size and mediastinal contours are within normal limits. Both lungs are clear. The visualized skeletal structures are unremarkable. IMPRESSION: No active cardiopulmonary disease. Electronically Signed   By: Danae Orleans M.D.   On: 03/20/2019 04:05    Procedures Procedures (including critical care time)  Medications Ordered in ED Medications  sodium chloride flush (NS) 0.9 % injection 3 mL (has no administration in time range)     Initial Impression / Assessment and Plan / ED Course  I have reviewed the triage vital signs and the nursing notes.  Pertinent labs & imaging results that were available during my care of the patient were reviewed by me and considered in my medical decision making (see chart for details).        Johnny Clark is a 53 y.o. male with no significant past medical history who presents with palpitations and  some chest discomfort.  Patient reports that this morning he woke up around 2 AM and had palpitations lasting about 10 minutes.  He reports there was an associate discomfort and diaphoresis but otherwise no shortness of breath nausea or vomiting.  He reports he never had this before.  He says that after a while he tried to go back to bed but felt the palpitations again prompting him to seek evaluation.  He reports that after arrival to the emergency department he has not had any further palpitations or symptoms.  He denies any exertional component and denies any pleuritic component to the discomfort.  No recent leg pain or leg swelling.  No recent trauma.  No other complaints on arrival.  On exam, patient's lungs are clear and chest is nontender.  No murmur appreciated.  Patient good pulses in upper and lower extremities.  No lower extremity tenderness or swelling.  Patient has scar on his left knee from prior knee replacement.  Exam otherwise unremarkable.  While waiting in the emergency department to be evaluated, patient had screening lab work and chest x-ray.  Troponin negative x2.  Patient's chest x-ray shows no acute abnormalities.  EKG shows no STEMI.  Also no arrhythmia seen.  Patient's potassium was normal however due to the concern for possible palpitations or arrhythmia, and magnesium will also be assessed.  If magnesium is normal, anticipate discharge with plans to follow-up with cardiology for possible heart monitor placement and evaluation.  Patient remains asymptomatic in the emergency department.  Anticipate p.o. challenge and ambulation and discharge after work-up is completed.  Magnesium is normal.  Work-up is reassuring.  He has not had any arrhythmias here in emergency part.  Spoke with cardiology who will set up a Holter monitor and evaluation for him in the next several days.  Patient understands follow-up instructions and return precautions.  Patient discharged in good condition  with no further symptoms.  Final Clinical Impressions(s) / ED Diagnoses   Final diagnoses:  Palpitations  Chest pain, unspecified type    ED Discharge Orders    None     Clinical Impression: 1. Palpitations   2. Chest pain, unspecified type     Disposition: Discharge  Condition: Good  I have discussed the results, Dx and Tx plan with the pt(&  family if present). He/she/they expressed understanding and agree(s) with the plan. Discharge instructions discussed at great length. Strict return precautions discussed and pt &/or family have verbalized understanding of the instructions. No further questions at time of discharge.    New Prescriptions   No medications on file    [facility] [address] [id number] [place] [id number] [phone]       Kahli Fitzgerald, Canary Brimhristopher J, MD 03/20/19 1049

## 2019-03-20 NOTE — Discharge Instructions (Signed)
The cardiology team will call you to set up an appointment to both be seen and to get the monitor placed as we discussed.  Please avoid stimulants such as caffeine or drugs.  Please stay hydrated and rest.  If any symptoms change or worsen, please return to the nearest emergency department.

## 2019-03-22 ENCOUNTER — Telehealth: Payer: Self-pay | Admitting: Radiology

## 2019-03-22 NOTE — Telephone Encounter (Signed)
Attempted to reach patient to verify his info and briefly go over the monitor that was ordered for him before having it mailed.

## 2019-03-23 ENCOUNTER — Telehealth: Payer: Self-pay | Admitting: *Deleted

## 2019-03-23 NOTE — Telephone Encounter (Signed)
Left message please call [phone] to set up cardiac event monitor.

## 2019-04-03 ENCOUNTER — Telehealth: Payer: Self-pay | Admitting: *Deleted

## 2019-04-03 NOTE — Telephone Encounter (Signed)
[employer] to ship a 30 day cardiac event monitor to your home.  Instructions reviewed briefly as they are included in the monitor kit. 

## 2019-06-19 ENCOUNTER — Telehealth: Payer: Self-pay | Admitting: Orthopaedic Surgery

## 2019-06-19 NOTE — Telephone Encounter (Signed)
Patient's wife Marzetta Board called regarding pre meds before dental, please call @ [phone]

## 2019-06-19 NOTE — Telephone Encounter (Signed)
Okay to send Amoxicillin?  

## 2019-06-20 ENCOUNTER — Telehealth: Payer: Self-pay

## 2019-06-20 NOTE — Telephone Encounter (Signed)
yes

## 2019-06-20 NOTE — Telephone Encounter (Signed)
[facility] called concerning pre-medication for patient.  Cb# [phone].  Please advise.  Thank you.

## 2019-06-21 ENCOUNTER — Other Ambulatory Visit: Payer: Self-pay

## 2019-06-21 MED ORDER — AMOXICILLIN 500 MG PO TABS: ORAL_TABLET | ORAL | 0 refills | Status: DC

## 2019-06-21 NOTE — Telephone Encounter (Signed)
Called Dentist office back no answer. LMOM with details.   RX was sent to patients pharm.

## 2019-06-21 NOTE — Telephone Encounter (Signed)
Rx Called into pharm.  Called patient no answer. LMOM with details

## 2019-07-27 ENCOUNTER — Telehealth: Payer: Self-pay | Admitting: Orthopaedic Surgery

## 2019-07-27 NOTE — Telephone Encounter (Signed)
Patient wife Johnny Clark called and stated that patient need Rx for dental appt. Appt is 08/02/19 and needs antibiotic because had knee replacement 05/02/18.  Please call to advise.  [phone]

## 2019-07-28 ENCOUNTER — Other Ambulatory Visit: Payer: Self-pay | Admitting: Physician Assistant

## 2019-07-28 MED ORDER — AMOXICILLIN 500 MG PO TABS: ORAL_TABLET | ORAL | 0 refills | Status: DC

## 2019-07-28 NOTE — Telephone Encounter (Signed)
Sent in

## 2019-10-16 NOTE — Telephone Encounter (Signed)
Pt cancelled - no baseline - monitor returned 05/03/19

## 2019-11-08 ENCOUNTER — Telehealth: Payer: Self-pay | Admitting: Orthopaedic Surgery

## 2019-11-08 NOTE — Telephone Encounter (Signed)
Pt scheduled for dental work on 11/14/19 @ Dr. Pernell Dupre and needs pre med antibiotic prior.   Pharmacy- [facility] on [address]

## 2019-11-10 ENCOUNTER — Other Ambulatory Visit: Payer: Self-pay | Admitting: Physician Assistant

## 2019-11-10 MED ORDER — AMOXICILLIN 500 MG PO CAPS: ORAL_CAPSULE | ORAL | 0 refills | Status: DC

## 2019-11-10 NOTE — Telephone Encounter (Signed)
Sent in

## 2019-11-10 NOTE — Telephone Encounter (Signed)
Called patient to advise Rx sent to pharm. 

## 2020-12-04 IMAGING — DX CHEST - 2 VIEW
2 series · 2 of 2 positions shown · non-contrast
Comparison: 12/30/2009

CLINICAL DATA: Chest pressure, tachycardia, and diaphoresis
beginning this morning.

EXAM:
CHEST - 2 VIEW

[chest pa]
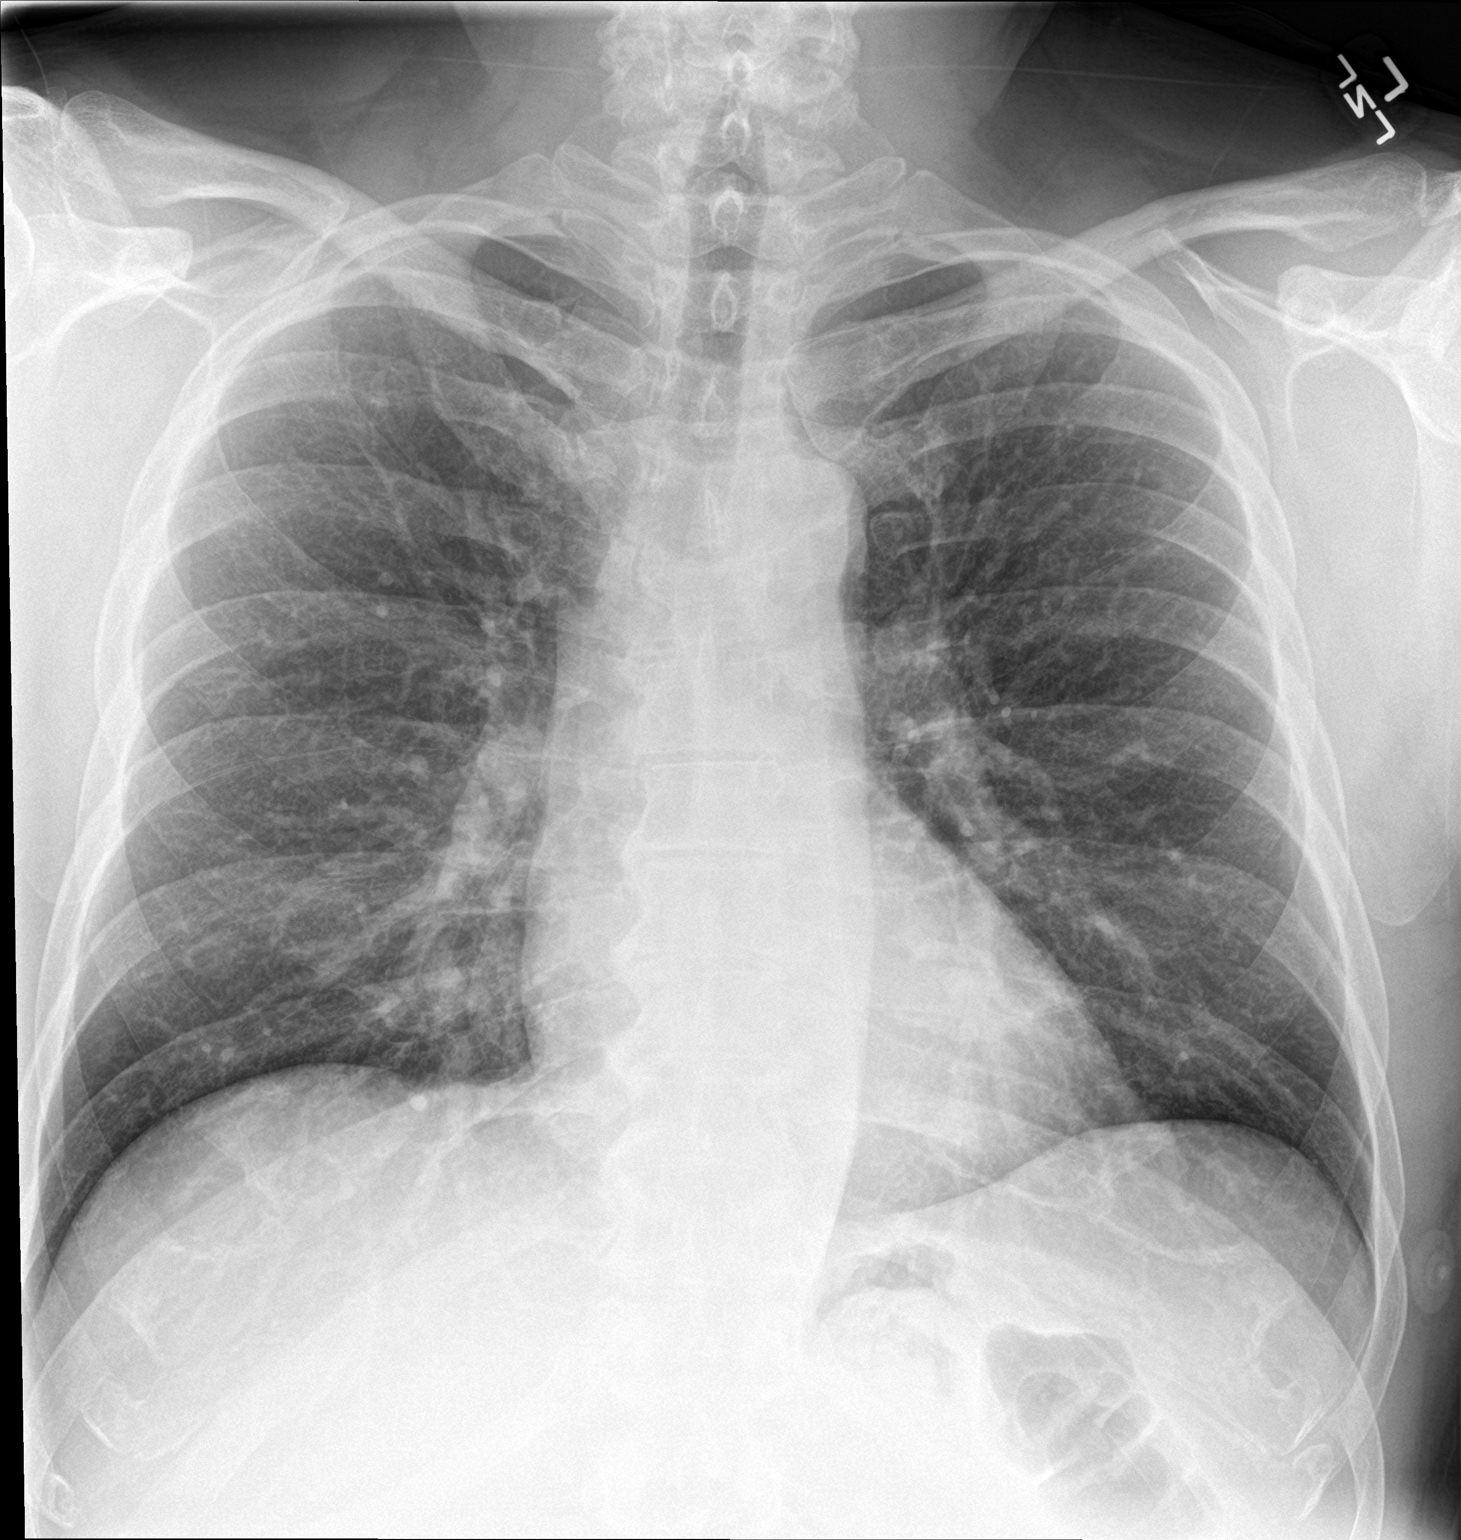

[chest lat]
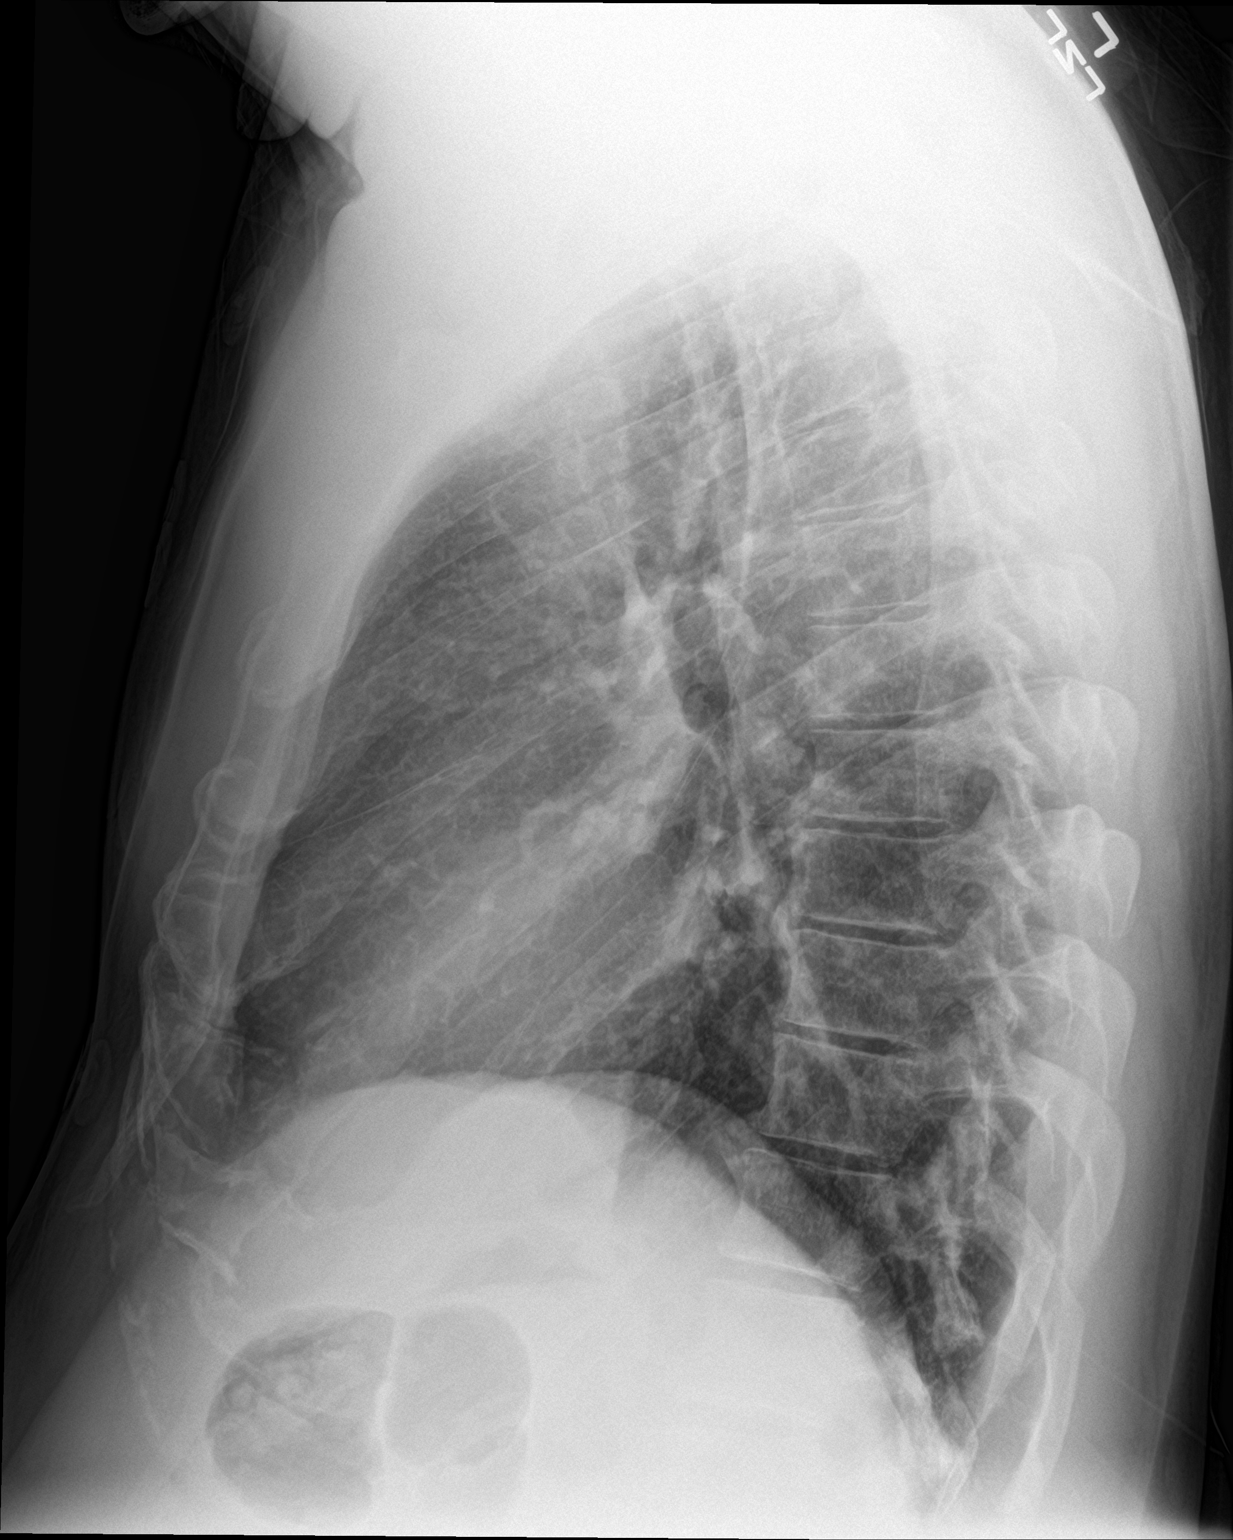

[2 of 2 positions shown; findings below may reference images not displayed]

FINDINGS: The heart size and mediastinal contours are within normal limits.
Both lungs are clear. The visualized skeletal structures are
unremarkable.
IMPRESSION: No active cardiopulmonary disease.

## 2021-04-29 ENCOUNTER — Telehealth: Payer: Self-pay | Admitting: Family Medicine

## 2021-04-29 NOTE — Telephone Encounter (Signed)
Received call from patient's spouse to schedule CPE. Patient hasn't been seen in over 3 years. Please advise if ok to schedule at [phone].

## 2021-04-29 NOTE — Telephone Encounter (Signed)
Ok to schedule per PCP.

## 2021-05-21 NOTE — Telephone Encounter (Signed)
CPE has already been scheduled.

## 2021-06-05 ENCOUNTER — Ambulatory Visit (INDEPENDENT_AMBULATORY_CARE_PROVIDER_SITE_OTHER): Payer: 59 | Admitting: Family Medicine

## 2021-06-05 ENCOUNTER — Encounter: Payer: Self-pay | Admitting: Family Medicine

## 2021-06-05 ENCOUNTER — Other Ambulatory Visit: Payer: Self-pay

## 2021-06-05 VITALS — BP 132/72 | HR 67 | Temp 98.4°F | Resp 18 | Ht 72.0 in | Wt 225.0 lb

## 2021-06-05 DIAGNOSIS — Z1211 Encounter for screening for malignant neoplasm of colon: Secondary | ICD-10-CM

## 2021-06-05 DIAGNOSIS — F410 Panic disorder [episodic paroxysmal anxiety] without agoraphobia: Secondary | ICD-10-CM

## 2021-06-05 DIAGNOSIS — Z96652 Presence of left artificial knee joint: Secondary | ICD-10-CM | POA: Diagnosis not present

## 2021-06-05 DIAGNOSIS — Z0000 Encounter for general adult medical examination without abnormal findings: Secondary | ICD-10-CM | POA: Diagnosis not present

## 2021-06-05 DIAGNOSIS — R0601 Orthopnea: Secondary | ICD-10-CM

## 2021-06-05 MED ORDER — ALPRAZOLAM 0.5 MG PO TABS: 0.5000 mg | ORAL_TABLET | Freq: Three times a day (TID) | ORAL | 0 refills | Status: DC | PRN

## 2021-06-05 NOTE — Progress Notes (Signed)
Subjective:    Patient ID: Johnny Clark, male    DOB: 1965-08-28, 55 y.o.   MRN: [id number]  HPI  Patient has not been seen in quite some time.  He states that recently he has been finding himself waking up from a dead sleep short of breath and panicked.  He states that he feels like he cannot breathe.  He will feel "weird, anxious, and scared.  It usually only occurs at night.  He will get up and pace the floors.  1 night it was so bad that he went to the emergency room in 2020 and was evaluated and the work-up was normal.  He denies any chest pain.  He denies any dyspnea on exertion.  He does snore loudly.  In fact his wife occasionally has to sleep in another room however there is no witnessed apneic episodes.  He is due for a colonoscopy.  He is also due for fasting lab work No past medical history on file.  Past Surgical History:  Procedure Laterality Date   HERNIA REPAIR     JOINT REPLACEMENT     TOTAL KNEE ARTHROPLASTY Left 05/02/2018   Procedure: LEFT TOTAL KNEE ARTHROPLASTY;  Surgeon: Tarry Kos, MD;  Location: [facility];  Service: Orthopedics;  Laterality: Left;   WISDOM TOOTH EXTRACTION      Current Outpatient Medications on File Prior to Visit  Medication Sig Dispense Refill   amoxicillin (AMOXIL) 500 MG capsule Take 4 tabs one hour prior to dental work 12 capsule 0   aspirin EC 81 MG tablet Take 1 tablet (81 mg total) by mouth 2 (two) times daily. 84 tablet 0   Calcium Polycarbophil (FIBER-CAPS PO) Take 2 tablets by mouth daily.     methocarbamol (ROBAXIN) 750 MG tablet Take 1 tablet (750 mg total) by mouth 2 (two) times daily as needed for muscle spasms. 60 tablet 0   Multiple Vitamin (MULTIVITAMIN) tablet Take 1 tablet by mouth daily.     mupirocin ointment (BACTROBAN) 2 % Apply 1 application topically 2 (two) times daily. 22 g 0   ondansetron (ZOFRAN) 4 MG tablet Take 1-2 tablets (4-8 mg total) by mouth every 8 (eight) hours as needed for nausea or vomiting. 40 tablet 0    oxyCODONE (OXY IR/ROXICODONE) 5 MG immediate release tablet Take 1-3 tablets (5-15 mg total) by mouth every 4 (four) hours as needed. 30 tablet 0   oxyCODONE (OXY IR/ROXICODONE) 5 MG immediate release tablet Take 1-3 tablets (5-15 mg total) by mouth 2 (two) times daily as needed. 30 tablet 0   oxyCODONE (OXYCONTIN) 10 mg 12 hr tablet TAKE 1 TAB (10 mg) bid 4 tablet 0   promethazine (PHENERGAN) 25 MG tablet Take 1 tablet (25 mg total) by mouth every 6 (six) hours as needed for nausea. 30 tablet 1   senna-docusate (SENOKOT S) 8.6-50 MG tablet Take 1 tablet by mouth at bedtime as needed. 30 tablet 1   traMADol (ULTRAM) 50 MG tablet Take 1 tablet (50 mg total) by mouth every 6 (six) hours as needed. 30 tablet 0   No current facility-administered medications on file prior to visit.   No Known Allergies Social History   Socioeconomic History   Marital status: Single    Spouse name: Not on file   Number of children: Not on file   Years of education: Not on file   Highest education level: Not on file  Occupational History   Not on file  Tobacco Use  Smoking status: Never   Smokeless tobacco: Never  Vaping Use   Vaping Use: Never used  Substance and Sexual Activity   Alcohol use: Yes    Comment: occasional social   Drug use: Never   Sexual activity: Yes  Other Topics Concern   Not on file  Social History Narrative   Not on file   Social Determinants of Health   Financial Resource Strain: Not on file  Food Insecurity: Not on file  Transportation Needs: Not on file  Physical Activity: Not on file  Stress: Not on file  Social Connections: Not on file  Intimate Partner Violence: Not on file   Family History  Problem Relation Age of Onset   Hypertension Mother    Hypertension Father       Review of Systems  All other systems reviewed and are negative.     Objective:   Physical Exam Vitals reviewed.  Constitutional:      General: He is not in acute distress.     Appearance: He is well-developed. He is not diaphoretic.  HENT:     Head: Normocephalic and atraumatic.     Right Ear: External ear normal.     Left Ear: External ear normal.     Nose: Nose normal.     Mouth/Throat:     Pharynx: No oropharyngeal exudate.  Eyes:     General: No scleral icterus.       Right eye: No discharge.        Left eye: No discharge.     Conjunctiva/sclera: Conjunctivae normal.     Pupils: Pupils are equal, round, and reactive to light.  Neck:     Thyroid: No thyromegaly.     Vascular: No JVD.     Trachea: No tracheal deviation.  Cardiovascular:     Rate and Rhythm: Normal rate and regular rhythm.     Heart sounds: Normal heart sounds. No murmur heard.   No friction rub. No gallop.  Pulmonary:     Effort: Pulmonary effort is normal. No respiratory distress.     Breath sounds: Normal breath sounds. No stridor. No wheezing or rales.  Chest:     Chest wall: No tenderness.  Abdominal:     General: Bowel sounds are normal. There is no distension.     Palpations: Abdomen is soft. There is no mass.     Tenderness: There is no abdominal tenderness. There is no guarding or rebound.     Hernia: No hernia is present.  Genitourinary:    Rectum: Normal.  Musculoskeletal:        General: No deformity.     Cervical back: Normal range of motion and neck supple.     Left knee: No LCL laxity.Normal meniscus.  Lymphadenopathy:     Cervical: No cervical adenopathy.  Skin:    General: Skin is warm.     Coloration: Skin is not pale.     Findings: No erythema or rash.  Neurological:     Mental Status: He is alert and oriented to person, place, and time.     Cranial Nerves: No cranial nerve deficit.     Sensory: No sensory deficit.     Motor: No abnormal muscle tone.     Coordination: Coordination normal.     Deep Tendon Reflexes: Reflexes normal.  Psychiatric:        Behavior: Behavior normal.        Thought Content: Thought content normal.  Judgment:  Judgment normal.          Assessment & Plan:  General medical exam - Plan: CBC with Differential/Platelet, COMPLETE METABOLIC PANEL WITH GFR, Lipid panel, PSA  Status post total left knee replacement  Panic attacks  Orthopnea  Colon cancer screening I believe the patient is having panic attacks at night.  When I am concerned that he may be having sleep apnea triggered the panic attacks given the fact it only occurs to when he is sleeping and awakens him from a dead sleep short of breath.  Therefore I would recommend a sleep study to evaluate further.  However I will start the patient on Xanax 0.5 mg p.o. every 8 hours as needed.  I cautioned against dependency and habituation.  Meanwhile check CBC CMP lipid panel and PSA.  Also schedule the patient for a colonoscopy.

## 2021-06-06 LAB — CBC WITH DIFFERENTIAL/PLATELET
Absolute Monocytes: 448 cells/uL (ref 200–950)
Basophils Absolute: 38 cells/uL (ref 0–200)
Basophils Relative: 0.7 %
Eosinophils Absolute: 162 cells/uL (ref 15–500)
Eosinophils Relative: 3 %
HCT: 51.1 % — ABNORMAL HIGH (ref 38.5–50.0)
Hemoglobin: 17.2 g/dL — ABNORMAL HIGH (ref 13.2–17.1)
Lymphs Abs: 1550 cells/uL (ref 850–3900)
MCH: 30.4 pg (ref 27.0–33.0)
MCHC: 33.7 g/dL (ref 32.0–36.0)
MCV: 90.4 fL (ref 80.0–100.0)
MPV: 10.6 fL (ref 7.5–12.5)
Monocytes Relative: 8.3 %
Neutro Abs: 3202 cells/uL (ref 1500–7800)
Neutrophils Relative %: 59.3 %
Platelets: 238 10*3/uL (ref 140–400)
RBC: 5.65 10*6/uL (ref 4.20–5.80)
RDW: 13.3 % (ref 11.0–15.0)
Total Lymphocyte: 28.7 %
WBC: 5.4 10*3/uL (ref 3.8–10.8)

## 2021-06-06 LAB — COMPLETE METABOLIC PANEL WITH GFR
AG Ratio: 1.5 (calc) (ref 1.0–2.5)
ALT: 31 U/L (ref 9–46)
AST: 28 U/L (ref 10–35)
Albumin: 4.5 g/dL (ref 3.6–5.1)
Alkaline phosphatase (APISO): 59 U/L (ref 35–144)
BUN: 12 mg/dL (ref 7–25)
CO2: 27 mmol/L (ref 20–32)
Calcium: 9.8 mg/dL (ref 8.6–10.3)
Chloride: 103 mmol/L (ref 98–110)
Creat: 0.92 mg/dL (ref 0.70–1.30)
Globulin: 3 g/dL (calc) (ref 1.9–3.7)
Glucose, Bld: 89 mg/dL (ref 65–99)
Potassium: 4.7 mmol/L (ref 3.5–5.3)
Sodium: 140 mmol/L (ref 135–146)
Total Bilirubin: 0.8 mg/dL (ref 0.2–1.2)
Total Protein: 7.5 g/dL (ref 6.1–8.1)
eGFR: 99 mL/min/{1.73_m2} (ref >=60)

## 2021-06-06 LAB — LIPID PANEL
Cholesterol: 191 mg/dL (ref <200)
HDL: 51 mg/dL (ref >=40)
LDL Cholesterol (Calc): 123 mg/dL (calc) — ABNORMAL HIGH
Non-HDL Cholesterol (Calc): 140 mg/dL (calc) — ABNORMAL HIGH (ref <130)
Total CHOL/HDL Ratio: 3.7 (calc) (ref <5.0)
Triglycerides: 80 mg/dL (ref <150)

## 2021-06-06 LAB — PSA: PSA: 0.63 ng/mL (ref <=4.00)

## 2021-06-09 ENCOUNTER — Telehealth (INDEPENDENT_AMBULATORY_CARE_PROVIDER_SITE_OTHER): Payer: 59 | Admitting: Nurse Practitioner

## 2021-06-09 ENCOUNTER — Encounter: Payer: Self-pay | Admitting: Nurse Practitioner

## 2021-06-09 ENCOUNTER — Other Ambulatory Visit: Payer: Self-pay

## 2021-06-09 DIAGNOSIS — J069 Acute upper respiratory infection, unspecified: Secondary | ICD-10-CM

## 2021-06-09 DIAGNOSIS — Z20828 Contact with and (suspected) exposure to other viral communicable diseases: Secondary | ICD-10-CM | POA: Diagnosis not present

## 2021-06-09 MED ORDER — FLUTICASONE PROPIONATE 50 MCG/ACT NA SUSP: 2.0000 | Freq: Every day | NASAL | 6 refills | Status: AC

## 2021-06-09 MED ORDER — OSELTAMIVIR PHOSPHATE 75 MG PO CAPS: 75.0000 mg | ORAL_CAPSULE | Freq: Every day | ORAL | 0 refills | Status: AC

## 2021-06-09 MED ORDER — SALINE SPRAY 0.65 % NA SOLN: 1.0000 | NASAL | 0 refills | Status: DC | PRN

## 2021-06-09 NOTE — Progress Notes (Signed)
Subjective:    Patient ID: Johnny Clark, male    DOB: Jan 10, 1966, 55 y.o.   MRN: [id number]  HPI: Johnny Clark is a 55 y.o. male presenting virtually for upper respiratory infection.  Chief Complaint  Patient presents with   URI   UPPER RESPIRATORY TRACT INFECTION Onset: Saturday am  Fever: no; 98.6 Body aches: yes Chills: yes Cough:  yes; congested  - did not sleep last night Shortness of breath: no Wheezing: no Chest pain: no Chest tightness: no Chest congestion: yes Nasal congestion: yes Runny nose: yes Post nasal drip: yes Sneezing: yes Sore throat: yes Swollen glands: yes Sinus pressure: yes Headache: yes Face pain: no Toothache: no Ear pain: no  Ear pressure: no  Eyes red/itching:no Eye drainage/crusting: no  Nausea: no  Vomiting: no Diarrhea: no  Change in appetite:  yes; decreased   Loss of taste/smell: yes  Rash: no Fatigue: yes Sick contacts:  yes; co workers  tested positive  Strep contacts: no  Context: worse Recurrent sinusitis: no Treatments attempted: Nyquil, Mucinex, Relief with OTC medications: yes  No Known Allergies  Outpatient Encounter Medications as of 06/09/2021  Medication Sig   fluticasone (FLONASE) 50 MCG/ACT nasal spray Place 2 sprays into both nostrils daily.   oseltamivir (TAMIFLU) 75 MG capsule Take 1 capsule (75 mg total) by mouth daily for 5 days.   sodium chloride (OCEAN) 0.65 % SOLN nasal spray Place 1 spray into both nostrils as needed for congestion.   ALPRAZolam (XANAX) 0.5 MG tablet Take 1 tablet (0.5 mg total) by mouth 3 (three) times daily as needed.   Calcium Polycarbophil (FIBER-CAPS PO) Take 2 tablets by mouth daily.   Multiple Vitamin (MULTIVITAMIN) tablet Take 1 tablet by mouth daily.   ondansetron (ZOFRAN) 4 MG tablet Take 1-2 tablets (4-8 mg total) by mouth every 8 (eight) hours as needed for nausea or vomiting. (Patient not taking: Reported on 06/05/2021)   No facility-administered encounter  medications on file as of 06/09/2021.    Patient Active Problem List   Diagnosis Date Noted   Status post total left knee replacement 05/02/2018   Osteoarthritis 03/18/2018    No past medical history on file.  Relevant past medical, surgical, family and social history reviewed and updated as indicated. Interim medical history since our last visit reviewed.  Review of Systems Per HPI unless specifically indicated above     Objective:    There were no vitals taken for this visit.  Wt Readings from Last 3 Encounters:  06/05/21 225 lb (102.1 kg)  06/10/18 220 lb 0.3 oz (99.8 kg)  05/02/18 220 lb 0.3 oz (99.8 kg)    Physical Exam Vitals and nursing note reviewed.  Constitutional:      General: He is not in acute distress.    Appearance: He is not ill-appearing or toxic-appearing.  HENT:     Head: Normocephalic and atraumatic.     Right Ear: External ear normal.     Left Ear: External ear normal.     Nose: Congestion and rhinorrhea present.     Mouth/Throat:     Mouth: Mucous membranes are moist.     Pharynx: Oropharynx is clear.  Eyes:     General: No scleral icterus.       Right eye: No discharge.        Left eye: No discharge.  Cardiovascular:     Comments: Unable to assess heart sounds via virtual visit. Pulmonary:     Effort:  Pulmonary effort is normal. No respiratory distress.     Comments: Unable to assess breath sounds via virtual visit.  Patient talking in complete sentences during telemedicine visit.  No accessory muscle use.  Occasional congested sounding cough. Skin:    Coloration: Skin is not jaundiced or pale.     Findings: No erythema.  Neurological:     Mental Status: He is alert and oriented to person, place, and time.  Psychiatric:        Mood and Affect: Mood normal.        Behavior: Behavior normal.        Thought Content: Thought content normal.        Judgment: Judgment normal.      Assessment & Plan:  1. Upper respiratory tract infection,  unspecified type Acute x 2 days.   Suspect influenza given chills, body aches, exposure.  Start prophylactic treatment with tamiflu. Obtain viral panel.  Reassured patient that symptoms and exam findings are most consistent with a viral upper respiratory infection and explained lack of efficacy of antibiotics against viruses.  Discussed expected course and features suggestive of secondary bacterial infection.  Continue supportive care. Increase fluid intake with water or electrolyte solution like pedialyte. Encouraged acetaminophen as needed for fever/pain. Encouraged salt water gargling, chloraseptic spray and throat lozenges. Encouraged OTC guaifenesin. Encouraged saline sinus flushes and/or neti with humidified air.  Follow up with no improvement in symptoms.  - SARS-CoV-2 RNA (COVID-19) and Respiratory Viral Panel, Qualitative NAAT - oseltamivir (TAMIFLU) 75 MG capsule; Take 1 capsule (75 mg total) by mouth daily for 5 days.  Dispense: 5 capsule; Refill: 0 - sodium chloride (OCEAN) 0.65 % SOLN nasal spray; Place 1 spray into both nostrils as needed for congestion.  Dispense: 88 mL; Refill: 0 - fluticasone (FLONASE) 50 MCG/ACT nasal spray; Place 2 sprays into both nostrils daily.  Dispense: 16 g; Refill: 6  2. Exposure to the flu  - oseltamivir (TAMIFLU) 75 MG capsule; Take 1 capsule (75 mg total) by mouth daily for 5 days.  Dispense: 5 capsule; Refill: 0   Follow up plan: Return if symptoms worsen or fail to improve.   Due to the catastrophic nature of the COVID-19 pandemic, this video visit was completed soley via audio and visual contact via [facility] due to the restrictions of the COVID-19 pandemic.  All issues as above were discussed and addressed. Physical exam was done as above through visual confirmation on [facility]. If it was felt that the patient should be evaluated in the office, they were directed there. The patient verbally consented to this visit. Location of the patient:  work Location of the provider: work Those involved with this call:  Provider: Cathlean Marseilles, DNP, FNP-C CMA: n/a Front Desk/Registration: Percival Spanish  Time spent on call:  8 minutes with patient face to face via video conference. More than 50% of this time was spent in counseling and coordination of care. 17 minutes total spent in review of patient's record and preparation of their chart. I verified patient identity using two factors (patient name and date of birth). Patient consents verbally to being seen via telemedicine visit today.

## 2021-06-11 ENCOUNTER — Other Ambulatory Visit: Payer: Self-pay

## 2021-06-11 DIAGNOSIS — D582 Other hemoglobinopathies: Secondary | ICD-10-CM

## 2021-06-11 LAB — SARS-COV-2 RNA (COVID-19) RESP VIRAL PNL QL NAAT
Adenovirus B: NOT DETECTED
HUMAN PARAINFLU VIRUS 1: NOT DETECTED
HUMAN PARAINFLU VIRUS 2: NOT DETECTED
HUMAN PARAINFLU VIRUS 3: NOT DETECTED
INFLUENZA A SUBTYPE H1: NOT DETECTED
INFLUENZA A SUBTYPE H3: DETECTED — AB
Influenza A: DETECTED — AB
Influenza B: NOT DETECTED
Metapneumovirus: NOT DETECTED
Respiratory Syncytial Virus A: NOT DETECTED
Respiratory Syncytial Virus B: NOT DETECTED
Rhinovirus: NOT DETECTED
SARS CoV2 RNA: NOT DETECTED

## 2021-07-09 ENCOUNTER — Other Ambulatory Visit: Payer: Self-pay

## 2021-07-09 NOTE — Telephone Encounter (Signed)
LOV 06/05/21 Last refill 06/05/21, #30, 0 refills  Pt states he has been taking 2 at a time and this has been helping his anxiety.  Please review, thanks!

## 2021-07-10 MED ORDER — ALPRAZOLAM 0.5 MG PO TABS: 0.5000 mg | ORAL_TABLET | Freq: Three times a day (TID) | ORAL | 0 refills | Status: DC | PRN

## 2021-07-15 ENCOUNTER — Other Ambulatory Visit: Payer: 59

## 2021-07-15 ENCOUNTER — Telehealth: Payer: Self-pay

## 2021-07-15 ENCOUNTER — Other Ambulatory Visit: Payer: Self-pay

## 2021-07-15 ENCOUNTER — Other Ambulatory Visit: Payer: Self-pay | Admitting: Family Medicine

## 2021-07-15 DIAGNOSIS — D582 Other hemoglobinopathies: Secondary | ICD-10-CM

## 2021-07-15 LAB — CBC WITH DIFFERENTIAL/PLATELET
Absolute Monocytes: 594 cells/uL (ref 200–950)
Basophils Absolute: 28 cells/uL (ref 0–200)
Basophils Relative: 0.5 %
Eosinophils Absolute: 190 cells/uL (ref 15–500)
Eosinophils Relative: 3.4 %
HCT: 50.4 % — ABNORMAL HIGH (ref 38.5–50.0)
Hemoglobin: 16.8 g/dL (ref 13.2–17.1)
Lymphs Abs: 1792 cells/uL (ref 850–3900)
MCH: 30.3 pg (ref 27.0–33.0)
MCHC: 33.3 g/dL (ref 32.0–36.0)
MCV: 91 fL (ref 80.0–100.0)
MPV: 10.7 fL (ref 7.5–12.5)
Monocytes Relative: 10.6 %
Neutro Abs: 2996 cells/uL (ref 1500–7800)
Neutrophils Relative %: 53.5 %
Platelets: 227 10*3/uL (ref 140–400)
RBC: 5.54 10*6/uL (ref 4.20–5.80)
RDW: 13.6 % (ref 11.0–15.0)
Total Lymphocyte: 32 %
WBC: 5.6 10*3/uL (ref 3.8–10.8)

## 2021-07-15 MED ORDER — ALPRAZOLAM 1 MG PO TABS: 0.5000 mg | ORAL_TABLET | Freq: Every evening | ORAL | 1 refills | Status: DC | PRN

## 2021-07-15 NOTE — Telephone Encounter (Signed)
Pt was seen in office today but forgot to mention to provider how he has been taking his med (XANAX) 0.5 MG. Pt states that he has been taking 2 of these at night and they seem to help, but wanted to know from pcp if he should take more or if dr will want to up the dosage. Please advise.  Cb#: [phone]

## 2021-11-12 ENCOUNTER — Encounter: Payer: Self-pay | Admitting: Family Medicine

## 2021-12-26 ENCOUNTER — Telehealth: Payer: Self-pay | Admitting: Family Medicine

## 2021-12-26 NOTE — Telephone Encounter (Signed)
Patient left msg requesting a refill on ALPRAZolam (XANAX) 1 MG tablet [[id number]]    Order Details Dose: 0.5 mg Route: Oral Frequency: At bedtime PRN for sleep  Dispense Quantity: 30 tablet Refills: 1        Sig: Take 0.5 tablets (0.5 mg total) by mouth at bedtime as needed for sleep.       Start Date: 07/15/21 End Date: --  Written Date: 07/15/21 Expiration Date: 01/11/22  Original Order:  ALPRAZolam Prudy Feeler) 0.5 MG tablet [[id number]]  Providers  Authorizing Provider:   Donita Brooks, MD  [address] [place], [place] [address]  Phone:  [phone]   Fax:  [phone]  DEA #:  [id number]   NPI:  [id number]      Ordering User:  Donita Brooks, MD       Pharmacy  [facility] [phone] [place], [place] - [address]

## 2021-12-26 NOTE — Telephone Encounter (Signed)
Please advise. Thank you

## 2021-12-28 ENCOUNTER — Other Ambulatory Visit: Payer: Self-pay | Admitting: Family Medicine

## 2021-12-28 MED ORDER — ALPRAZOLAM 1 MG PO TABS: 0.5000 mg | ORAL_TABLET | Freq: Every evening | ORAL | 1 refills | Status: DC | PRN

## 2022-05-19 ENCOUNTER — Telehealth: Payer: Self-pay | Admitting: Family Medicine

## 2022-05-19 NOTE — Telephone Encounter (Signed)
Left message to return call; need to reschedule appt. Dr. Pickard on vacation week of 06/08/22. 

## 2022-06-08 ENCOUNTER — Encounter: Payer: Self-pay | Admitting: Family Medicine

## 2022-06-17 ENCOUNTER — Encounter: Payer: Self-pay | Admitting: Family Medicine

## 2022-06-17 ENCOUNTER — Ambulatory Visit (INDEPENDENT_AMBULATORY_CARE_PROVIDER_SITE_OTHER): Payer: 59 | Admitting: Family Medicine

## 2022-06-17 VITALS — BP 124/76 | HR 75 | Ht 72.0 in | Wt 225.0 lb

## 2022-06-17 DIAGNOSIS — Z1211 Encounter for screening for malignant neoplasm of colon: Secondary | ICD-10-CM

## 2022-06-17 DIAGNOSIS — R5383 Other fatigue: Secondary | ICD-10-CM

## 2022-06-17 DIAGNOSIS — Z0000 Encounter for general adult medical examination without abnormal findings: Secondary | ICD-10-CM

## 2022-06-17 NOTE — Progress Notes (Signed)
Subjective:    Patient ID: Johnny Clark, male    DOB: [birth year], 56 y.o.   MRN: [id number]  HPI Patient is a very pleasant 56 year old Caucasian gentleman who presents today for complete physical exam.  He does report profound fatigue.  He denies any chest pain or shortness of breath or dyspnea on exertion.  He denies any weight loss or fevers or chills.  He just reports lack of energy.  He denies any depression.  He does occasionally have panic attacks for which she takes Xanax but other than that he is doing well.  He has never had a colonoscopy.  He declines a colonoscopy but he will consent to Cologuard.  He is due for prostate cancer screening.  He declines a flu shot.  We discussed the COVID booster and also shingles vaccine.  He states that he will consider the shingles vaccine. No past medical history on file.  Past Surgical History:  Procedure Laterality Date   HERNIA REPAIR     JOINT REPLACEMENT     TOTAL KNEE ARTHROPLASTY Left 05/02/2018   Procedure: LEFT TOTAL KNEE ARTHROPLASTY;  Surgeon: Tarry Kos, MD;  Location: [facility];  Service: Orthopedics;  Laterality: Left;   WISDOM TOOTH EXTRACTION      Current Outpatient Medications on File Prior to Visit  Medication Sig Dispense Refill   ALPRAZolam (XANAX) 1 MG tablet Take 0.5 tablets (0.5 mg total) by mouth at bedtime as needed for sleep. 30 tablet 1   Calcium Polycarbophil (FIBER-CAPS PO) Take 2 tablets by mouth daily.     fluticasone (FLONASE) 50 MCG/ACT nasal spray Place 2 sprays into both nostrils daily. 16 g 6   Multiple Vitamin (MULTIVITAMIN) tablet Take 1 tablet by mouth daily.     ondansetron (ZOFRAN) 4 MG tablet Take 1-2 tablets (4-8 mg total) by mouth every 8 (eight) hours as needed for nausea or vomiting. (Patient not taking: Reported on 06/05/2021) 40 tablet 0   sodium chloride (OCEAN) 0.65 % SOLN nasal spray Place 1 spray into both nostrils as needed for congestion. 88 mL 0   No current facility-administered  medications on file prior to visit.   No Known Allergies Social History   Socioeconomic History   Marital status: Married    Spouse name: Not on file   Number of children: Not on file   Years of education: Not on file   Highest education level: Not on file  Occupational History   Not on file  Tobacco Use   Smoking status: Never   Smokeless tobacco: Never  Vaping Use   Vaping Use: Never used  Substance and Sexual Activity   Alcohol use: Yes    Comment: occasional social   Drug use: Never   Sexual activity: Yes  Other Topics Concern   Not on file  Social History Narrative   Not on file   Social Determinants of Health   Financial Resource Strain: Not on file  Food Insecurity: Not on file  Transportation Needs: Not on file  Physical Activity: Not on file  Stress: Not on file  Social Connections: Not on file  Intimate Partner Violence: Not on file   Family History  Problem Relation Age of Onset   Hypertension Mother    Hypertension Father       Review of Systems  All other systems reviewed and are negative.     Objective:   Physical Exam Vitals reviewed.  Constitutional:      General: He is  not in acute distress.    Appearance: He is well-developed. He is not diaphoretic.  HENT:     Head: Normocephalic and atraumatic.     Right Ear: External ear normal.     Left Ear: External ear normal.     Nose: Nose normal.     Mouth/Throat:     Pharynx: No oropharyngeal exudate.  Eyes:     General: No scleral icterus.       Right eye: No discharge.        Left eye: No discharge.     Conjunctiva/sclera: Conjunctivae normal.     Pupils: Pupils are equal, round, and reactive to light.  Neck:     Thyroid: No thyromegaly.     Vascular: No JVD.     Trachea: No tracheal deviation.  Cardiovascular:     Rate and Rhythm: Normal rate and regular rhythm.     Heart sounds: Normal heart sounds. No murmur heard.    No friction rub. No gallop.  Pulmonary:     Effort:  Pulmonary effort is normal. No respiratory distress.     Breath sounds: Normal breath sounds. No stridor. No wheezing or rales.  Chest:     Chest wall: No tenderness.  Abdominal:     General: Bowel sounds are normal. There is no distension.     Palpations: Abdomen is soft. There is no mass.     Tenderness: There is no abdominal tenderness. There is no guarding or rebound.     Hernia: No hernia is present.  Genitourinary:    Rectum: Normal.  Musculoskeletal:        General: No deformity.     Cervical back: Normal range of motion and neck supple.     Left knee: No LCL laxity.Normal meniscus.  Lymphadenopathy:     Cervical: No cervical adenopathy.  Skin:    General: Skin is warm.     Coloration: Skin is not pale.     Findings: No erythema or rash.  Neurological:     Mental Status: He is alert and oriented to person, place, and time.     Cranial Nerves: No cranial nerve deficit.     Sensory: No sensory deficit.     Motor: No abnormal muscle tone.     Coordination: Coordination normal.     Deep Tendon Reflexes: Reflexes normal.  Psychiatric:        Behavior: Behavior normal.        Thought Content: Thought content normal.        Judgment: Judgment normal.          Assessment & Plan:  Colon cancer screening - Plan: Cologuard  General medical exam - Plan: CBC with Differential/Platelet, Lipid panel, COMPLETE METABOLIC PANEL WITH GFR, PSA, TSH, Vitamin B12, Testosterone Total,Free,Bio, Males  Fatigue, unspecified type - Plan: CBC with Differential/Platelet, Lipid panel, COMPLETE METABOLIC PANEL WITH GFR, TSH, Vitamin B12, Testosterone Total,Free,Bio, Males Patient will agree to allow me to schedule him for Cologuard.  I have ordered that.  I recommended a flu shot, COVID booster, and the shingles vaccine.  Check CBC CMP lipid panel and PSA related to his physical exam.  Check a TSH B12 and testosterone level due to his chronic fatigue.  The remainder of his physical exam today is  normal.  Regular anticipatory guidance is provided

## 2022-06-18 LAB — CBC WITH DIFFERENTIAL/PLATELET
Absolute Monocytes: 470 cells/uL (ref 200–950)
Basophils Absolute: 38 cells/uL (ref 0–200)
Basophils Relative: 0.7 %
Eosinophils Absolute: 173 cells/uL (ref 15–500)
Eosinophils Relative: 3.2 %
HCT: 48.7 % (ref 38.5–50.0)
Hemoglobin: 16.5 g/dL (ref 13.2–17.1)
Lymphs Abs: 1744 cells/uL (ref 850–3900)
MCH: 30.7 pg (ref 27.0–33.0)
MCHC: 33.9 g/dL (ref 32.0–36.0)
MCV: 90.7 fL (ref 80.0–100.0)
MPV: 10.8 fL (ref 7.5–12.5)
Monocytes Relative: 8.7 %
Neutro Abs: 2975 cells/uL (ref 1500–7800)
Neutrophils Relative %: 55.1 %
Platelets: 233 10*3/uL (ref 140–400)
RBC: 5.37 10*6/uL (ref 4.20–5.80)
RDW: 13.1 % (ref 11.0–15.0)
Total Lymphocyte: 32.3 %
WBC: 5.4 10*3/uL (ref 3.8–10.8)

## 2022-06-18 LAB — COMPLETE METABOLIC PANEL WITH GFR
AG Ratio: 1.6 (calc) (ref 1.0–2.5)
ALT: 39 U/L (ref 9–46)
AST: 31 U/L (ref 10–35)
Albumin: 4.6 g/dL (ref 3.6–5.1)
Alkaline phosphatase (APISO): 64 U/L (ref 35–144)
BUN: 13 mg/dL (ref 7–25)
CO2: 26 mmol/L (ref 20–32)
Calcium: 9.9 mg/dL (ref 8.6–10.3)
Chloride: 106 mmol/L (ref 98–110)
Creat: 1.03 mg/dL (ref 0.70–1.30)
Globulin: 2.8 g/dL (calc) (ref 1.9–3.7)
Glucose, Bld: 89 mg/dL (ref 65–99)
Potassium: 4.6 mmol/L (ref 3.5–5.3)
Sodium: 141 mmol/L (ref 135–146)
Total Bilirubin: 0.8 mg/dL (ref 0.2–1.2)
Total Protein: 7.4 g/dL (ref 6.1–8.1)
eGFR: 86 mL/min/{1.73_m2} (ref >=60)

## 2022-06-18 LAB — PSA: PSA: 0.63 ng/mL (ref <=4.00)

## 2022-06-18 LAB — LIPID PANEL
Cholesterol: 206 mg/dL — ABNORMAL HIGH (ref <200)
HDL: 52 mg/dL (ref >=40)
LDL Cholesterol (Calc): 135 mg/dL (calc) — ABNORMAL HIGH
Non-HDL Cholesterol (Calc): 154 mg/dL (calc) — ABNORMAL HIGH (ref <130)
Total CHOL/HDL Ratio: 4 (calc) (ref <5.0)
Triglycerides: 87 mg/dL (ref <150)

## 2022-06-18 LAB — TESTOSTERONE TOTAL,FREE,BIO, MALES
Albumin: 4.6 g/dL (ref 3.6–5.1)
Sex Hormone Binding: 32 nmol/L (ref 10–50)
Testosterone, Bioavailable: 150.5 ng/dL (ref 110.0–575.0)
Testosterone, Free: 71.7 pg/mL (ref 46.0–224.0)
Testosterone: 518 ng/dL (ref 250–827)

## 2022-06-18 LAB — VITAMIN B12: Vitamin B-12: 456 pg/mL (ref 200–1100)

## 2022-06-18 LAB — TSH: TSH: 2.01 mIU/L (ref 0.40–4.50)

## 2022-06-19 ENCOUNTER — Other Ambulatory Visit: Payer: Self-pay

## 2022-06-19 MED ORDER — ROSUVASTATIN CALCIUM 10 MG PO TABS: 10.0000 mg | ORAL_TABLET | Freq: Every day | ORAL | 3 refills | Status: DC

## 2022-07-09 LAB — COLOGUARD: COLOGUARD: NEGATIVE

## 2022-08-10 ENCOUNTER — Ambulatory Visit (INDEPENDENT_AMBULATORY_CARE_PROVIDER_SITE_OTHER): Payer: 59 | Admitting: Family Medicine

## 2022-08-10 ENCOUNTER — Encounter: Payer: Self-pay | Admitting: Family Medicine

## 2022-08-10 VITALS — BP 120/80 | HR 87 | Temp 97.4°F | Ht 73.0 in | Wt 234.0 lb

## 2022-08-10 DIAGNOSIS — J019 Acute sinusitis, unspecified: Secondary | ICD-10-CM | POA: Diagnosis not present

## 2022-08-10 DIAGNOSIS — B9689 Other specified bacterial agents as the cause of diseases classified elsewhere: Secondary | ICD-10-CM | POA: Diagnosis not present

## 2022-08-10 MED ORDER — AMOXICILLIN-POT CLAVULANATE 875-125 MG PO TABS: 1.0000 | ORAL_TABLET | Freq: Two times a day (BID) | ORAL | 0 refills | Status: DC

## 2022-08-10 NOTE — Progress Notes (Signed)
Acute Office Visit  Subjective:     Patient ID: Johnny Clark, male    DOB: 1965-09-30, 57 y.o.   MRN: [id number]  Chief Complaint  Patient presents with   Sinusitis    Sinusitis   Patient is in today for 3 days of left sided facial pressure, mucopurulent rhinorrhea, and sinus headache. Denies fever, cough, sob, n/v, chest pain. Has tried mucinex. No sick exposures.   Review of Systems  All other systems reviewed and are negative.   No past medical history on file. Past Surgical History:  Procedure Laterality Date   HERNIA REPAIR     JOINT REPLACEMENT     TOTAL KNEE ARTHROPLASTY Left 05/02/2018   Procedure: LEFT TOTAL KNEE ARTHROPLASTY;  Surgeon: Leandrew Koyanagi, MD;  Location: [place];  Service: Orthopedics;  Laterality: Left;   WISDOM TOOTH EXTRACTION     Current Outpatient Medications on File Prior to Visit  Medication Sig Dispense Refill   ALPRAZolam (XANAX) 1 MG tablet Take 0.5 tablets (0.5 mg total) by mouth at bedtime as needed for sleep. 30 tablet 1   Calcium Polycarbophil (FIBER-CAPS PO) Take 2 tablets by mouth daily.     Multiple Vitamin (MULTIVITAMIN) tablet Take 1 tablet by mouth daily.     rosuvastatin (CRESTOR) 10 MG tablet Take 1 tablet (10 mg total) by mouth daily. 90 tablet 3   sodium chloride (OCEAN) 0.65 % SOLN nasal spray Place 1 spray into both nostrils as needed for congestion. 88 mL 0   fluticasone (FLONASE) 50 MCG/ACT nasal spray Place 2 sprays into both nostrils daily. (Patient not taking: Reported on 08/10/2022) 16 g 6   ondansetron (ZOFRAN) 4 MG tablet Take 1-2 tablets (4-8 mg total) by mouth every 8 (eight) hours as needed for nausea or vomiting. (Patient not taking: Reported on 08/10/2022) 40 tablet 0   No current facility-administered medications on file prior to visit.   No Known Allergies      Objective:    BP 120/80   Pulse 87   Temp (!) 97.4 F (36.3 C) (Oral)   Ht 6\' 1"  (1.854 m)   Wt 234 lb (106.1 kg)   SpO2 97%   BMI 30.87  kg/m    Physical Exam Vitals and nursing note reviewed.  Constitutional:      Appearance: Normal appearance. He is normal weight.  HENT:     Head: Normocephalic and atraumatic.     Nose: Congestion present.     Left Sinus: Frontal sinus tenderness present.     Mouth/Throat:     Pharynx: Oropharynx is clear.  Eyes:     Conjunctiva/sclera: Conjunctivae normal.  Pulmonary:     Effort: Pulmonary effort is normal.     Breath sounds: Normal breath sounds.  Skin:    General: Skin is warm and dry.     Capillary Refill: Capillary refill takes less than 2 seconds.  Neurological:     General: No focal deficit present.     Mental Status: He is alert and oriented to person, place, and time. Mental status is at baseline.  Psychiatric:        Mood and Affect: Mood normal.        Behavior: Behavior normal.        Thought Content: Thought content normal.        Judgment: Judgment normal.     No results found for any visits on 08/10/22.      Assessment & Plan:   Problem List  Items Addressed This Visit       Respiratory   Acute bacterial rhinosinusitis - Primary    Patients symptoms consistent with acute bacterial rhinosinusitis. Start Augmentin 875-125mg  BID for 7 days. Instructed to return to office if symptoms persist or worsen. May continue Mucinex, Flonase, and Tylenol for symptomatic management.      Relevant Medications   amoxicillin-clavulanate (AUGMENTIN) 875-125 MG tablet    Meds ordered this encounter  Medications   amoxicillin-clavulanate (AUGMENTIN) 875-125 MG tablet    Sig: Take 1 tablet by mouth 2 (two) times daily.    Dispense:  14 tablet    Refill:  0    Order Specific Question:   Supervising Provider    Answer:   Jenna Luo T [[id number]]    Return if symptoms worsen or fail to improve.  Rubie Maid, FNP

## 2022-08-10 NOTE — Assessment & Plan Note (Addendum)
Patients symptoms consistent with acute bacterial rhinosinusitis. Start Augmentin 875-125mg  BID for 7 days. Instructed to return to office if symptoms persist or worsen. May continue Mucinex, Flonase, and Tylenol for symptomatic management.

## 2022-08-12 ENCOUNTER — Encounter: Payer: Self-pay | Admitting: Family Medicine

## 2022-12-21 ENCOUNTER — Ambulatory Visit: Payer: 59 | Admitting: Family Medicine

## 2023-05-27 ENCOUNTER — Encounter: Payer: Self-pay | Admitting: Family Medicine

## 2023-05-27 ENCOUNTER — Ambulatory Visit (INDEPENDENT_AMBULATORY_CARE_PROVIDER_SITE_OTHER): Payer: 59 | Admitting: Family Medicine

## 2023-05-27 VITALS — BP 120/80 | HR 71 | Temp 97.5°F | Ht 73.0 in | Wt 227.0 lb

## 2023-05-27 DIAGNOSIS — Z0000 Encounter for general adult medical examination without abnormal findings: Secondary | ICD-10-CM | POA: Diagnosis not present

## 2023-05-27 DIAGNOSIS — Z23 Encounter for immunization: Secondary | ICD-10-CM

## 2023-05-27 DIAGNOSIS — Z125 Encounter for screening for malignant neoplasm of prostate: Secondary | ICD-10-CM

## 2023-05-27 MED ORDER — ALPRAZOLAM 1 MG PO TABS: 0.5000 mg | ORAL_TABLET | Freq: Every evening | ORAL | 1 refills | Status: DC | PRN

## 2023-05-27 MED ORDER — MELOXICAM 15 MG PO TABS: 15.0000 mg | ORAL_TABLET | Freq: Every day | ORAL | 3 refills | Status: AC

## 2023-05-27 NOTE — Addendum Note (Signed)
Addended by: Venia Carbon K on: 05/27/2023 10:40 AM   Modules accepted: Orders

## 2023-05-27 NOTE — Progress Notes (Signed)
Subjective:    Patient ID: Johnny Clark, male    DOB: 04/16/1966, 57 y.o.   MRN: [id number]  HPI Patient is a very pleasant 57 year old Caucasian gentleman here today for complete physical exam.  The patient had a Cologuard last year that was negative.  He is due to repeat that testing in 2026.  He is due for a tetanus shot, shingles vaccine, and a flu shot.  He politely declines the other vaccinations but he will not get a tetanus shot today.  He does complain of pain in his right knee related osteomyelitis.  He has has rare panic attacks at night because of trouble sleeping.  He would like a refill on his Xanax which he uses sparingly.  Otherwise he is doing well with no concerns. No past medical history on file.  Past Surgical History:  Procedure Laterality Date   HERNIA REPAIR     JOINT REPLACEMENT     TOTAL KNEE ARTHROPLASTY Left 05/02/2018   Procedure: LEFT TOTAL KNEE ARTHROPLASTY;  Surgeon: Tarry Kos, MD;  Location: [facility];  Service: Orthopedics;  Laterality: Left;   WISDOM TOOTH EXTRACTION      Current Outpatient Medications on File Prior to Visit  Medication Sig Dispense Refill   ALPRAZolam (XANAX) 1 MG tablet Take 0.5 tablets (0.5 mg total) by mouth at bedtime as needed for sleep. 30 tablet 1   amoxicillin-clavulanate (AUGMENTIN) 875-125 MG tablet Take 1 tablet by mouth 2 (two) times daily. 14 tablet 0   Calcium Polycarbophil (FIBER-CAPS PO) Take 2 tablets by mouth daily.     fluticasone (FLONASE) 50 MCG/ACT nasal spray Place 2 sprays into both nostrils daily. (Patient not taking: Reported on 08/10/2022) 16 g 6   Multiple Vitamin (MULTIVITAMIN) tablet Take 1 tablet by mouth daily.     ondansetron (ZOFRAN) 4 MG tablet Take 1-2 tablets (4-8 mg total) by mouth every 8 (eight) hours as needed for nausea or vomiting. (Patient not taking: Reported on 08/10/2022) 40 tablet 0   rosuvastatin (CRESTOR) 10 MG tablet Take 1 tablet (10 mg total) by mouth daily. 90 tablet 3   sodium  chloride (OCEAN) 0.65 % SOLN nasal spray Place 1 spray into both nostrils as needed for congestion. 88 mL 0   No current facility-administered medications on file prior to visit.   No Known Allergies Social History   Socioeconomic History   Marital status: Married    Spouse name: Not on file   Number of children: Not on file   Years of education: Not on file   Highest education level: Not on file  Occupational History   Not on file  Tobacco Use   Smoking status: Never   Smokeless tobacco: Never  Vaping Use   Vaping status: Never Used  Substance and Sexual Activity   Alcohol use: Yes    Comment: occasional social   Drug use: Never   Sexual activity: Yes  Other Topics Concern   Not on file  Social History Narrative   Not on file   Social Determinants of Health   Financial Resource Strain: Not on file  Food Insecurity: Not on file  Transportation Needs: Not on file  Physical Activity: Not on file  Stress: Not on file  Social Connections: Not on file  Intimate Partner Violence: Not on file   Family History  Problem Relation Age of Onset   Hypertension Mother    Hypertension Father       Review of Systems  All  other systems reviewed and are negative.      Objective:   Physical Exam Vitals reviewed.  Constitutional:      General: He is not in acute distress.    Appearance: He is well-developed. He is not diaphoretic.  HENT:     Head: Normocephalic and atraumatic.     Right Ear: External ear normal.     Left Ear: External ear normal.     Nose: Nose normal.     Mouth/Throat:     Pharynx: No oropharyngeal exudate.  Eyes:     General: No scleral icterus.       Right eye: No discharge.        Left eye: No discharge.     Conjunctiva/sclera: Conjunctivae normal.     Pupils: Pupils are equal, round, and reactive to light.  Neck:     Thyroid: No thyromegaly.     Vascular: No JVD.     Trachea: No tracheal deviation.  Cardiovascular:     Rate and Rhythm:  Normal rate and regular rhythm.     Heart sounds: Normal heart sounds. No murmur heard.    No friction rub. No gallop.  Pulmonary:     Effort: Pulmonary effort is normal. No respiratory distress.     Breath sounds: Normal breath sounds. No stridor. No wheezing or rales.  Chest:     Chest wall: No tenderness.  Abdominal:     General: Bowel sounds are normal. There is no distension.     Palpations: Abdomen is soft. There is no mass.     Tenderness: There is no abdominal tenderness. There is no guarding or rebound.     Hernia: No hernia is present.  Musculoskeletal:        General: No deformity.     Cervical back: Normal range of motion and neck supple.     Left knee: No LCL laxity.Normal meniscus.  Lymphadenopathy:     Cervical: No cervical adenopathy.  Skin:    General: Skin is warm.     Coloration: Skin is not pale.     Findings: No erythema or rash.  Neurological:     Mental Status: He is alert and oriented to person, place, and time.     Cranial Nerves: No cranial nerve deficit.     Sensory: No sensory deficit.     Motor: No abnormal muscle tone.     Coordination: Coordination normal.     Deep Tendon Reflexes: Reflexes normal.  Psychiatric:        Behavior: Behavior normal.        Thought Content: Thought content normal.        Judgment: Judgment normal.           Assessment & Plan:  General medical exam - Plan: CBC with Differential/Platelet, COMPLETE METABOLIC PANEL WITH GFR, Lipid panel, PSA  Prostate cancer screening - Plan: PSA Physical exam today is normal.  Check CBC, CMP, fasting lipid panel, and PSA.  I would like to see his LDL cholesterol less than 469.  Blood pressure today is excellent.  Screen for prostate cancer with PSA.  Colon cancer screening is up-to-date.  Recommended a tetanus shot which the patient received today.  Recommend flu shot and shingles vaccine cheaper refill Xanax to use sparingly.  Also gave the patient a prescription for meloxicam that  he can use sparingly for knee pain.

## 2023-05-28 LAB — COMPLETE METABOLIC PANEL WITH GFR
AG Ratio: 1.5 (calc) (ref 1.0–2.5)
ALT: 28 U/L (ref 9–46)
AST: 29 U/L (ref 10–35)
Albumin: 4.4 g/dL (ref 3.6–5.1)
Alkaline phosphatase (APISO): 74 U/L (ref 35–144)
BUN: 11 mg/dL (ref 7–25)
CO2: 26 mmol/L (ref 20–32)
Calcium: 9.8 mg/dL (ref 8.6–10.3)
Chloride: 103 mmol/L (ref 98–110)
Creat: 0.94 mg/dL (ref 0.70–1.30)
Globulin: 3 g/dL (ref 1.9–3.7)
Glucose, Bld: 91 mg/dL (ref 65–99)
Potassium: 4.4 mmol/L (ref 3.5–5.3)
Sodium: 141 mmol/L (ref 135–146)
Total Bilirubin: 0.5 mg/dL (ref 0.2–1.2)
Total Protein: 7.4 g/dL (ref 6.1–8.1)
eGFR: 96 mL/min/{1.73_m2} (ref >=60)

## 2023-05-28 LAB — LIPID PANEL
Cholesterol: 208 mg/dL — ABNORMAL HIGH (ref <200)
HDL: 56 mg/dL (ref >=40)
LDL Cholesterol (Calc): 135 mg/dL — ABNORMAL HIGH
Non-HDL Cholesterol (Calc): 152 mg/dL — ABNORMAL HIGH (ref <130)
Total CHOL/HDL Ratio: 3.7 (calc) (ref <5.0)
Triglycerides: 78 mg/dL (ref <150)

## 2023-05-28 LAB — CBC WITH DIFFERENTIAL/PLATELET
Absolute Lymphocytes: 1742 {cells}/uL (ref 850–3900)
Absolute Monocytes: 470 {cells}/uL (ref 200–950)
Basophils Absolute: 48 {cells}/uL (ref 0–200)
Basophils Relative: 1 %
Eosinophils Absolute: 197 {cells}/uL (ref 15–500)
Eosinophils Relative: 4.1 %
HCT: 50 % (ref 38.5–50.0)
Hemoglobin: 16.5 g/dL (ref 13.2–17.1)
MCH: 30.2 pg (ref 27.0–33.0)
MCHC: 33 g/dL (ref 32.0–36.0)
MCV: 91.4 fL (ref 80.0–100.0)
MPV: 10.7 fL (ref 7.5–12.5)
Monocytes Relative: 9.8 %
Neutro Abs: 2342 {cells}/uL (ref 1500–7800)
Neutrophils Relative %: 48.8 %
Platelets: 257 10*3/uL (ref 140–400)
RBC: 5.47 10*6/uL (ref 4.20–5.80)
RDW: 13.2 % (ref 11.0–15.0)
Total Lymphocyte: 36.3 %
WBC: 4.8 10*3/uL (ref 3.8–10.8)

## 2023-05-28 LAB — PSA: PSA: 1.88 ng/mL (ref <=4.00)

## 2023-05-31 ENCOUNTER — Other Ambulatory Visit: Payer: Self-pay | Admitting: Family Medicine

## 2023-05-31 ENCOUNTER — Other Ambulatory Visit: Payer: Self-pay

## 2023-05-31 DIAGNOSIS — E785 Hyperlipidemia, unspecified: Secondary | ICD-10-CM

## 2023-05-31 MED ORDER — ROSUVASTATIN CALCIUM 20 MG PO TABS: 20.0000 mg | ORAL_TABLET | Freq: Every day | ORAL | 3 refills | Status: DC

## 2023-07-28 ENCOUNTER — Other Ambulatory Visit: Payer: Self-pay | Admitting: Family Medicine

## 2023-07-30 ENCOUNTER — Ambulatory Visit: Payer: Self-pay | Admitting: Family Medicine

## 2023-07-30 NOTE — Telephone Encounter (Signed)
 Last time pt was seen by Dr Duanne he was told to up his dose of Crestor  and take double the pills per day. Pt is currently out and pharm will not renew d/t his 90 day that was filled  on 11/11.  Reason for Disposition . [1] Prescription refill request for NON-ESSENTIAL medicine (i.e., no harm to patient if med not taken) AND [2] triager unable to refill per department policy  Answer Assessment - Initial Assessment Questions 1. DRUG NAME: What medicine do you need to have refilled?     crestor   4. PRESCRIBING HCP: Who prescribed it? Reason: If prescribed by specialist, call should be referred to that group.     [facility]  Protocols used: Medication Refill and Renewal Call-A-AH

## 2023-07-30 NOTE — Telephone Encounter (Signed)
 Copied from CRM [id number]. Topic: Clinical - Medication Question >> Jul 29, 2023  2:23 PM [name] wrote: Reason for CRM: Rosuvastatin  (CRESTOR ) 20 MG tablet was refused to be refilled by pharmacy, the patient's wife stacy is unsure why, she states he has been out of this for a few weeks and needs a refill.   01/10 1200-Placed call to pharmacy listed on file, [facility] #[phone] ,.to see why they were not able to fill medication. Per chart review, Rosuvastatin  (CRESTOR ) mg was ordered on 05/31/23, 90 tabs dispensed with 3 refills. Left vm at pharmacy. Also placed call to patient's wife to get her undersatnding as well, no answer, unable to leave VM.

## 2023-08-02 ENCOUNTER — Other Ambulatory Visit: Payer: Self-pay

## 2023-08-02 DIAGNOSIS — E785 Hyperlipidemia, unspecified: Secondary | ICD-10-CM

## 2023-08-02 MED ORDER — ROSUVASTATIN CALCIUM 20 MG PO TABS: 20.0000 mg | ORAL_TABLET | Freq: Every day | ORAL | 3 refills | Status: AC

## 2023-08-26 ENCOUNTER — Encounter: Payer: Self-pay | Admitting: Family Medicine

## 2023-08-26 ENCOUNTER — Ambulatory Visit: Payer: 59 | Admitting: Family Medicine

## 2023-08-26 VITALS — BP 120/72 | HR 65 | Temp 97.5°F | Ht 73.0 in | Wt 233.0 lb

## 2023-08-26 DIAGNOSIS — E785 Hyperlipidemia, unspecified: Secondary | ICD-10-CM

## 2023-08-26 NOTE — Progress Notes (Signed)
 Subjective:    Patient ID: Johnny Clark, male    DOB: [birth year], 58 y.o.   MRN: [id number]  HPI On the patient's recent physical, his LDL cholesterol was elevated at 135.  Therefore I recommended Lipitor 20 mg daily.  The patient was accidentally started on Crestor  20 mg daily.  He denies any myalgias or right upper quadrant pain.  He seems to be tolerating the medicine well.  He is here today to recheck his cholesterol. Past Medical History:  Diagnosis Date   Hyperlipidemia     Past Surgical History:  Procedure Laterality Date   HERNIA REPAIR     JOINT REPLACEMENT     TOTAL KNEE ARTHROPLASTY Left 05/02/2018   Procedure: LEFT TOTAL KNEE ARTHROPLASTY;  Surgeon: Jerri Kay HERO, MD;  Location: [facility] OR;  Service: Orthopedics;  Laterality: Left;   WISDOM TOOTH EXTRACTION      Current Outpatient Medications on File Prior to Visit  Medication Sig Dispense Refill   meloxicam  (MOBIC ) 15 MG tablet Take 1 tablet (15 mg total) by mouth daily. 30 tablet 3   Multiple Vitamin (MULTIVITAMIN) tablet Take 1 tablet by mouth daily.     rosuvastatin  (CRESTOR ) 20 MG tablet Take 1 tablet (20 mg total) by mouth daily. 90 tablet 3   ALPRAZolam  (XANAX ) 1 MG tablet Take 0.5 tablets (0.5 mg total) by mouth at bedtime as needed for sleep. (Patient not taking: Reported on 08/26/2023) 30 tablet 1   fluticasone  (FLONASE ) 50 MCG/ACT nasal spray Place 2 sprays into both nostrils daily. (Patient not taking: Reported on 08/26/2023) 16 g 6   No current facility-administered medications on file prior to visit.   No Known Allergies Social History   Socioeconomic History   Marital status: Married    Spouse name: Not on file   Number of children: Not on file   Years of education: Not on file   Highest education level: 12th grade  Occupational History   Not on file  Tobacco Use   Smoking status: Never   Smokeless tobacco: Never  Vaping Use   Vaping status: Never Used  Substance and Sexual Activity   Alcohol use:  Yes    Comment: occasional social   Drug use: Never   Sexual activity: Yes  Other Topics Concern   Not on file  Social History Narrative   Not on file   Social Drivers of Health   Financial Resource Strain: Low Risk  (08/23/2023)   Overall Financial Resource Strain (CARDIA)    Difficulty of Paying Living Expenses: Not very hard  Food Insecurity: No Food Insecurity (08/23/2023)   Hunger Vital Sign    Worried About Running Out of Food in the Last Year: Never true    Ran Out of Food in the Last Year: Never true  Transportation Needs: No Transportation Needs (08/23/2023)   PRAPARE - Administrator, Civil Service (Medical): No    Lack of Transportation (Non-Medical): No  Physical Activity: Unknown (08/23/2023)   Exercise Vital Sign    Days of Exercise per Week: 7 days    Minutes of Exercise per Session: Patient declined  Stress: Stress Concern Present (08/23/2023)   [employer] of Occupational Health - Occupational Stress Questionnaire    Feeling of Stress : To some extent  Social Connections: Moderately Isolated (08/23/2023)   Social Connection and Isolation Panel [NHANES]    Frequency of Communication with Friends and Family: More than three times a week    Frequency of  Social Gatherings with Friends and Family: Once a week    Attends Religious Services: Never    Database Administrator or Organizations: No    Attends Engineer, Structural: Not on file    Marital Status: Married  Catering Manager Violence: Not on file   Family History  Problem Relation Age of Onset   Hypertension Mother    Hypertension Father       Review of Systems  All other systems reviewed and are negative.      Objective:   Physical Exam Vitals reviewed.  Constitutional:      General: He is not in acute distress.    Appearance: He is well-developed. He is not diaphoretic.  HENT:     Head: Normocephalic and atraumatic.  Neck:     Thyroid: No thyromegaly.     Vascular: No JVD.      Trachea: No tracheal deviation.  Cardiovascular:     Rate and Rhythm: Normal rate and regular rhythm.     Heart sounds: Normal heart sounds. No murmur heard.    No friction rub. No gallop.  Pulmonary:     Effort: Pulmonary effort is normal. No respiratory distress.     Breath sounds: Normal breath sounds. No stridor. No wheezing or rales.  Chest:     Chest wall: No tenderness.  Abdominal:     General: Bowel sounds are normal. There is no distension.     Palpations: Abdomen is soft. There is no mass.     Tenderness: There is no abdominal tenderness. There is no guarding or rebound.     Hernia: No hernia is present.  Musculoskeletal:     Left knee: No LCL laxity.Normal meniscus.  Neurological:     Mental Status: He is alert.     Motor: No abnormal muscle tone.           Assessment & Plan:  Hyperlipidemia, unspecified hyperlipidemia type - Plan: COMPLETE METABOLIC PANEL WITH GFR, Lipid panel  Ideally I would like to see the patient's LDL cholesterol under 100.  We discussed getting a coronary artery CT scan to rule stratify the patient.  If the patient has elevated coronary artery calcium  score, I would recommend treating his LDL cholesterol less than 70 and keeping the patient on a high intensity statin.  If his coronary artery calcium  score is 0, the patient could stop the statin therapy

## 2023-08-27 LAB — LIPID PANEL
Cholesterol: 140 mg/dL (ref <200)
HDL: 49 mg/dL (ref >=40)
LDL Cholesterol (Calc): 68 mg/dL
Non-HDL Cholesterol (Calc): 91 mg/dL (ref <130)
Total CHOL/HDL Ratio: 2.9 (calc) (ref <5.0)
Triglycerides: 142 mg/dL (ref <150)

## 2023-08-27 LAB — COMPLETE METABOLIC PANEL WITH GFR
AG Ratio: 1.6 (calc) (ref 1.0–2.5)
ALT: 40 U/L (ref 9–46)
AST: 33 U/L (ref 10–35)
Albumin: 4.4 g/dL (ref 3.6–5.1)
Alkaline phosphatase (APISO): 61 U/L (ref 35–144)
BUN: 16 mg/dL (ref 7–25)
CO2: 24 mmol/L (ref 20–32)
Calcium: 9.9 mg/dL (ref 8.6–10.3)
Chloride: 105 mmol/L (ref 98–110)
Creat: 0.93 mg/dL (ref 0.70–1.30)
Globulin: 2.8 g/dL (ref 1.9–3.7)
Glucose, Bld: 90 mg/dL (ref 65–99)
Potassium: 4.3 mmol/L (ref 3.5–5.3)
Sodium: 139 mmol/L (ref 135–146)
Total Bilirubin: 0.5 mg/dL (ref 0.2–1.2)
Total Protein: 7.2 g/dL (ref 6.1–8.1)
eGFR: 97 mL/min/{1.73_m2} (ref >=60)

## 2024-06-02 ENCOUNTER — Ambulatory Visit: Payer: 59 | Admitting: Family Medicine

## 2024-06-02 ENCOUNTER — Encounter: Payer: Self-pay | Admitting: Family Medicine

## 2024-06-02 VITALS — BP 124/72 | HR 68 | Temp 97.6°F | Ht 73.0 in | Wt 226.0 lb

## 2024-06-02 DIAGNOSIS — Z23 Encounter for immunization: Secondary | ICD-10-CM

## 2024-06-02 DIAGNOSIS — Z0001 Encounter for general adult medical examination with abnormal findings: Secondary | ICD-10-CM | POA: Diagnosis not present

## 2024-06-02 DIAGNOSIS — Z0000 Encounter for general adult medical examination without abnormal findings: Secondary | ICD-10-CM

## 2024-06-02 DIAGNOSIS — Z125 Encounter for screening for malignant neoplasm of prostate: Secondary | ICD-10-CM

## 2024-06-02 DIAGNOSIS — E785 Hyperlipidemia, unspecified: Secondary | ICD-10-CM

## 2024-06-02 MED ORDER — ALPRAZOLAM 1 MG PO TABS: 0.5000 mg | ORAL_TABLET | Freq: Every evening | ORAL | 1 refills | Status: AC | PRN

## 2024-06-02 NOTE — Addendum Note (Signed)
 Addended by: ANGELENA RONAL BRADLEY K on: 06/02/2024 09:11 AM   Modules accepted: Orders

## 2024-06-02 NOTE — Progress Notes (Signed)
 Subjective:    Patient ID: Johnny Clark, male    DOB: May 20, 1966, 58 y.o.   MRN: [id number]  HPI  Patient is a very pleasant 58 year old Caucasian gentleman here today for complete physical exam.  The patient had a Cologuard in 2023 that was negative.  He is due to repeat that testing in 2026.  Had tetanus last year.  Due for shingles, flu and pneumonia vaccine.  Patient declines a flu shot.  We discussed the pneumonia vaccine and the shingles vaccine.  He would like to get the shingles vaccine today.  Patient is using Xanax  sparingly for panic attacks.  He has claustrophobia and is unable to work in tight confined spaces.  When he is in a tight confined space, he starts to feel like he cannot breathe and he has to leave the area immediately.  He is requesting a note for work stating this.  I believe that that is appropriate.  We discussed medication to help prevent anxiety but at the present time he would rather just take the Xanax  sparingly.  On his physical exam today there is an atypical mole.  He is approximately 3 mm to 4 mm in diameter.  It is all in his middle back roughly around the level of T12 just to the right of the spine.  There is a slight hook like atypical projection coming off of the mole that makes it atypical in shape and asymmetric.  I have recommended a shave biopsy of this Past Medical History:  Diagnosis Date   Hyperlipidemia     Past Surgical History:  Procedure Laterality Date   HERNIA REPAIR     JOINT REPLACEMENT     TOTAL KNEE ARTHROPLASTY Left 05/02/2018   Procedure: LEFT TOTAL KNEE ARTHROPLASTY;  Surgeon: Jerri Kay HERO, MD;  Location: [facility] OR;  Service: Orthopedics;  Laterality: Left;   WISDOM TOOTH EXTRACTION      Current Outpatient Medications on File Prior to Visit  Medication Sig Dispense Refill   ALPRAZolam  (XANAX ) 1 MG tablet Take 0.5 tablets (0.5 mg total) by mouth at bedtime as needed for sleep. (Patient not taking: Reported on 08/26/2023) 30 tablet 1    fluticasone  (FLONASE ) 50 MCG/ACT nasal spray Place 2 sprays into both nostrils daily. (Patient not taking: Reported on 08/26/2023) 16 g 6   meloxicam  (MOBIC ) 15 MG tablet Take 1 tablet (15 mg total) by mouth daily. 30 tablet 3   Multiple Vitamin (MULTIVITAMIN) tablet Take 1 tablet by mouth daily.     rosuvastatin  (CRESTOR ) 20 MG tablet Take 1 tablet (20 mg total) by mouth daily. 90 tablet 3   No current facility-administered medications on file prior to visit.   No Known Allergies Social History   Socioeconomic History   Marital status: Married    Spouse name: Not on file   Number of children: Not on file   Years of education: Not on file   Highest education level: 12th grade  Occupational History   Not on file  Tobacco Use   Smoking status: Never   Smokeless tobacco: Never  Vaping Use   Vaping status: Never Used  Substance and Sexual Activity   Alcohol use: Yes    Comment: occasional social   Drug use: Never   Sexual activity: Yes  Other Topics Concern   Not on file  Social History Narrative   Not on file   Social Drivers of Health   Financial Resource Strain: Low Risk  (06/01/2024)   Overall  Financial Resource Strain (CARDIA)    Difficulty of Paying Living Expenses: Not very hard  Food Insecurity: No Food Insecurity (06/01/2024)   Hunger Vital Sign    Worried About Running Out of Food in the Last Year: Never true    Ran Out of Food in the Last Year: Never true  Transportation Needs: No Transportation Needs (06/01/2024)   PRAPARE - Administrator, Civil Service (Medical): No    Lack of Transportation (Non-Medical): No  Physical Activity: Sufficiently Active (06/01/2024)   Exercise Vital Sign    Days of Exercise per Week: 5 days    Minutes of Exercise per Session: 150+ min  Stress: Stress Concern Present (06/01/2024)   [employer] of Occupational Health - Occupational Stress Questionnaire    Feeling of Stress: Rather much  Social Connections:  Socially Integrated (06/01/2024)   Social Connection and Isolation Panel    Frequency of Communication with Friends and Family: More than three times a week    Frequency of Social Gatherings with Friends and Family: More than three times a week    Attends Religious Services: More than 4 times per year    Active Member of [employer] or Organizations: Yes    Attends Engineer, Structural: More than 4 times per year    Marital Status: Married  Catering Manager Violence: Not on file   Family History  Problem Relation Age of Onset   Hypertension Mother    Hypertension Father       Review of Systems  All other systems reviewed and are negative.      Objective:   Physical Exam Vitals reviewed.  Constitutional:      General: He is not in acute distress.    Appearance: He is well-developed. He is not diaphoretic.  HENT:     Head: Normocephalic and atraumatic.     Right Ear: External ear normal.     Left Ear: External ear normal.     Nose: Nose normal.     Mouth/Throat:     Pharynx: No oropharyngeal exudate.  Eyes:     General: No scleral icterus.       Right eye: No discharge.        Left eye: No discharge.     Conjunctiva/sclera: Conjunctivae normal.     Pupils: Pupils are equal, round, and reactive to light.  Neck:     Thyroid: No thyromegaly.     Vascular: No JVD.     Trachea: No tracheal deviation.  Cardiovascular:     Rate and Rhythm: Normal rate and regular rhythm.     Heart sounds: Normal heart sounds. No murmur heard.    No friction rub. No gallop.  Pulmonary:     Effort: Pulmonary effort is normal. No respiratory distress.     Breath sounds: Normal breath sounds. No stridor. No wheezing or rales.  Chest:     Chest wall: No tenderness.  Abdominal:     General: Bowel sounds are normal. There is no distension.     Palpations: Abdomen is soft. There is no mass.     Tenderness: There is no abdominal tenderness. There is no guarding or rebound.     Hernia: No  hernia is present.  Musculoskeletal:        General: No deformity.     Cervical back: Normal range of motion and neck supple.     Left knee: No LCL laxity.Normal meniscus.  Lymphadenopathy:     Cervical: No  cervical adenopathy.  Skin:    General: Skin is warm.     Coloration: Skin is not pale.     Findings: No erythema or rash.  Neurological:     Mental Status: He is alert and oriented to person, place, and time.     Cranial Nerves: No cranial nerve deficit.     Sensory: No sensory deficit.     Motor: No abnormal muscle tone.     Coordination: Coordination normal.     Deep Tendon Reflexes: Reflexes normal.  Psychiatric:        Behavior: Behavior normal.        Thought Content: Thought content normal.        Judgment: Judgment normal.           Assessment & Plan:  General medical exam - Plan: CBC with Differential/Platelet, Comprehensive metabolic panel with GFR, Lipid panel, PSA  Hyperlipidemia, unspecified hyperlipidemia type - Plan: CBC with Differential/Platelet, Comprehensive metabolic panel with GFR, Lipid panel, CT CARDIAC SCORING (SELF PAY ONLY)  Prostate cancer screening - Plan: PSA Physical exam today is normal except for the irregular mole.  I asked the patient to set up a time that we can do that shave biopsy at his convenience.  I will check a CBC a CMP and a lipid panel today.  Patient request a refill on his Xanax .  I will schedule the patient for a coronary artery calcium  score.  He received his shingles vaccine today.  He declines the flu shot and the pneumonia vaccine

## 2024-06-03 LAB — COMPREHENSIVE METABOLIC PANEL WITH GFR
AG Ratio: 1.5 (calc) (ref 1.0–2.5)
ALT: 24 U/L (ref 9–46)
AST: 25 U/L (ref 10–35)
Albumin: 4.6 g/dL (ref 3.6–5.1)
Alkaline phosphatase (APISO): 70 U/L (ref 35–144)
BUN: 12 mg/dL (ref 7–25)
CO2: 27 mmol/L (ref 20–32)
Calcium: 9.8 mg/dL (ref 8.6–10.3)
Chloride: 102 mmol/L (ref 98–110)
Creat: 0.88 mg/dL (ref 0.70–1.30)
Globulin: 3.1 g/dL (ref 1.9–3.7)
Glucose, Bld: 102 mg/dL — ABNORMAL HIGH (ref 65–99)
Potassium: 4.7 mmol/L (ref 3.5–5.3)
Sodium: 137 mmol/L (ref 135–146)
Total Bilirubin: 0.6 mg/dL (ref 0.2–1.2)
Total Protein: 7.7 g/dL (ref 6.1–8.1)
eGFR: 101 mL/min/1.73m2 (ref >=60)

## 2024-06-03 LAB — PSA: PSA: 0.63 ng/mL (ref <=4.00)

## 2024-06-03 LAB — CBC WITH DIFFERENTIAL/PLATELET
Absolute Lymphocytes: 1636 {cells}/uL (ref 850–3900)
Absolute Monocytes: 496 {cells}/uL (ref 200–950)
Basophils Absolute: 29 {cells}/uL (ref 0–200)
Basophils Relative: 0.5 %
Eosinophils Absolute: 171 {cells}/uL (ref 15–500)
Eosinophils Relative: 3 %
HCT: 50.4 % — ABNORMAL HIGH (ref 38.5–50.0)
Hemoglobin: 16.8 g/dL (ref 13.2–17.1)
MCH: 30.4 pg (ref 27.0–33.0)
MCHC: 33.3 g/dL (ref 32.0–36.0)
MCV: 91.3 fL (ref 80.0–100.0)
MPV: 10.7 fL (ref 7.5–12.5)
Monocytes Relative: 8.7 %
Neutro Abs: 3369 {cells}/uL (ref 1500–7800)
Neutrophils Relative %: 59.1 %
Platelets: 263 Thousand/uL (ref 140–400)
RBC: 5.52 Million/uL (ref 4.20–5.80)
RDW: 13.2 % (ref 11.0–15.0)
Total Lymphocyte: 28.7 %
WBC: 5.7 Thousand/uL (ref 3.8–10.8)

## 2024-06-03 LAB — LIPID PANEL
Cholesterol: 197 mg/dL (ref <200)
HDL: 54 mg/dL (ref >=40)
LDL Cholesterol (Calc): 126 mg/dL — ABNORMAL HIGH
Non-HDL Cholesterol (Calc): 143 mg/dL — ABNORMAL HIGH (ref <130)
Total CHOL/HDL Ratio: 3.6 (calc) (ref <5.0)
Triglycerides: 80 mg/dL (ref <150)

## 2024-06-05 ENCOUNTER — Ambulatory Visit: Payer: Self-pay | Admitting: Family Medicine

## 2024-06-27 ENCOUNTER — Ambulatory Visit: Admitting: Family Medicine

## 2024-07-04 ENCOUNTER — Ambulatory Visit: Admitting: Family Medicine

## 2024-07-04 VITALS — BP 120/72 | HR 78 | Temp 98.2°F | Ht 73.0 in | Wt 226.0 lb

## 2024-07-04 DIAGNOSIS — E7800 Pure hypercholesterolemia, unspecified: Secondary | ICD-10-CM | POA: Diagnosis not present

## 2024-07-04 DIAGNOSIS — D229 Melanocytic nevi, unspecified: Secondary | ICD-10-CM

## 2024-07-04 NOTE — Progress Notes (Signed)
 Subjective:    Patient ID: Johnny Clark, male    DOB: 1966/06/09, 58 y.o.   MRN: [id number]  HPI Patient presents today requesting surgical excision of an atypical mole.  It is approximately 3 mm to 4 mm in diameter.  It is all in his middle back roughly around the level of T12 just to the right of the spine.  There is a slight hook like atypical projection coming off of the mole that makes it atypical in shape and asymmetric.  I have recommended a shave biopsy of this Past Medical History:  Diagnosis Date   Hyperlipidemia     Past Surgical History:  Procedure Laterality Date   HERNIA REPAIR     JOINT REPLACEMENT     TOTAL KNEE ARTHROPLASTY Left 05/02/2018   Procedure: LEFT TOTAL KNEE ARTHROPLASTY;  Surgeon: Jerri Kay HERO, MD;  Location: [facility] OR;  Service: Orthopedics;  Laterality: Left;   WISDOM TOOTH EXTRACTION      Current Outpatient Medications on File Prior to Visit  Medication Sig Dispense Refill   ALPRAZolam  (XANAX ) 1 MG tablet Take 0.5 tablets (0.5 mg total) by mouth at bedtime as needed for sleep. 30 tablet 1   meloxicam  (MOBIC ) 15 MG tablet Take 1 tablet (15 mg total) by mouth daily. 30 tablet 3   Multiple Vitamin (MULTIVITAMIN) tablet Take 1 tablet by mouth daily.     rosuvastatin  (CRESTOR ) 20 MG tablet Take 1 tablet (20 mg total) by mouth daily. 90 tablet 3   fluticasone  (FLONASE ) 50 MCG/ACT nasal spray Place 2 sprays into both nostrils daily. (Patient not taking: Reported on 06/02/2024) 16 g 6   No current facility-administered medications on file prior to visit.   No Known Allergies Social History   Socioeconomic History   Marital status: Married    Spouse name: Not on file   Number of children: Not on file   Years of education: Not on file   Highest education level: 12th grade  Occupational History   Not on file  Tobacco Use   Smoking status: Never   Smokeless tobacco: Never  Vaping Use   Vaping status: Never Used  Substance and Sexual Activity    Alcohol use: Yes    Comment: occasional social   Drug use: Never   Sexual activity: Yes  Other Topics Concern   Not on file  Social History Narrative   Not on file   Social Drivers of Health   Tobacco Use: Low Risk (06/02/2024)   Patient History    Smoking Tobacco Use: Never    Smokeless Tobacco Use: Never    Passive Exposure: Not on file  Financial Resource Strain: Low Risk (06/01/2024)   Overall Financial Resource Strain (CARDIA)    Difficulty of Paying Living Expenses: Not very hard  Food Insecurity: No Food Insecurity (06/01/2024)   Epic    Worried About Programme Researcher, Broadcasting/film/video in the Last Year: Never true    Ran Out of Food in the Last Year: Never true  Transportation Needs: No Transportation Needs (06/01/2024)   Epic    Lack of Transportation (Medical): No    Lack of Transportation (Non-Medical): No  Physical Activity: Sufficiently Active (06/01/2024)   Exercise Vital Sign    Days of Exercise per Week: 5 days    Minutes of Exercise per Session: 150+ min  Stress: Stress Concern Present (06/01/2024)   [employer] of Occupational Health - Occupational Stress Questionnaire    Feeling of Stress: Rather much  Social Connections: Socially Integrated (06/01/2024)   Social Connection and Isolation Panel    Frequency of Communication with Friends and Family: More than three times a week    Frequency of Social Gatherings with Friends and Family: More than three times a week    Attends Religious Services: More than 4 times per year    Active Member of Clubs or Organizations: Yes    Attends Banker Meetings: More than 4 times per year    Marital Status: Married  Catering Manager Violence: Not on file  Depression (PHQ2-9): Low Risk (06/02/2024)   Depression (PHQ2-9)    PHQ-2 Score: 0  Alcohol Screen: Low Risk (06/01/2024)   Alcohol Screen    Last Alcohol Screening Score (AUDIT): 6  Housing: Unknown (06/01/2024)   Epic    Unable to Pay for Housing in the Last  Year: No    Number of Times Moved in the Last Year: Not on file    Homeless in the Last Year: No  Utilities: Not on file  Health Literacy: Not on file   Family History  Problem Relation Age of Onset   Hypertension Mother    Hypertension Father       Review of Systems  All other systems reviewed and are negative.      Objective:   Physical Exam Vitals reviewed.  Constitutional:      General: He is not in acute distress.    Appearance: He is well-developed. He is not diaphoretic.  HENT:     Head: Normocephalic and atraumatic.     Right Ear: External ear normal.     Left Ear: External ear normal.     Nose: Nose normal.     Mouth/Throat:     Pharynx: No oropharyngeal exudate.  Eyes:     General: No scleral icterus.       Right eye: No discharge.        Left eye: No discharge.     Conjunctiva/sclera: Conjunctivae normal.     Pupils: Pupils are equal, round, and reactive to light.  Neck:     Thyroid: No thyromegaly.     Vascular: No JVD.     Trachea: No tracheal deviation.  Cardiovascular:     Rate and Rhythm: Normal rate and regular rhythm.     Heart sounds: Normal heart sounds. No murmur heard.    No friction rub. No gallop.  Pulmonary:     Effort: Pulmonary effort is normal. No respiratory distress.     Breath sounds: Normal breath sounds. No stridor. No wheezing or rales.  Chest:     Chest wall: No tenderness.  Abdominal:     General: Bowel sounds are normal. There is no distension.     Palpations: Abdomen is soft. There is no mass.     Tenderness: There is no abdominal tenderness. There is no guarding or rebound.     Hernia: No hernia is present.  Musculoskeletal:        General: No deformity.     Cervical back: Normal range of motion and neck supple.     Left knee: No LCL laxity.Normal meniscus.  Lymphadenopathy:     Cervical: No cervical adenopathy.  Skin:    General: Skin is warm.     Coloration: Skin is not pale.     Findings: No erythema or rash.   Neurological:     Mental Status: He is alert and oriented to person, place, and time.  Cranial Nerves: No cranial nerve deficit.     Sensory: No sensory deficit.     Motor: No abnormal muscle tone.     Coordination: Coordination normal.     Deep Tendon Reflexes: Reflexes normal.  Psychiatric:        Behavior: Behavior normal.        Thought Content: Thought content normal.        Judgment: Judgment normal.           Assessment & Plan:  Atypical mole - Plan: Pathology Report ([facility])  Pure hypercholesterolemia - Plan: CT CARDIAC SCORING (SELF PAY ONLY) I anesthetized the skin in that area with 0.1% lidocaine  with epinephrine.  The patient was prepped and draped in sterile fashion.  I performed a shave biopsy of the entire lesion down to the subcutaneous fascia.  The lesion was sent to pathology in a labeled container.  Hemostasis was achieved with Drysol and a Band-Aid.  Await the biopsy results.  I rescheduled the patient's cardiac CT/coronary artery calcium  score

## 2024-07-06 LAB — TISSUE SPECIMEN

## 2024-07-06 LAB — PATHOLOGY REPORT

## 2024-07-07 ENCOUNTER — Ambulatory Visit: Payer: Self-pay | Admitting: Family Medicine

## 2024-07-14 ENCOUNTER — Ambulatory Visit (HOSPITAL_COMMUNITY)
Admission: RE | Admit: 2024-07-14 | Discharge: 2024-07-14 | Disposition: A | Discharge status: Home / Self Care | Payer: Self-pay | Source: Ambulatory Visit | Attending: Family Medicine | Admitting: Family Medicine

## 2024-07-14 DIAGNOSIS — E7800 Pure hypercholesterolemia, unspecified: Secondary | ICD-10-CM

## 2025-06-08 ENCOUNTER — Encounter: Admitting: Family Medicine
